# Patient Record
Sex: Female | Born: 1945 | Race: White | Hispanic: No | Marital: Married | Smoking: Never smoker
Health system: Southern US, Community
[De-identification: ages and names within clinical notes are randomized; demographics above are authoritative.]

## PROBLEM LIST (undated history)

## (undated) DIAGNOSIS — I499 Cardiac arrhythmia, unspecified: Secondary | ICD-10-CM

## (undated) DIAGNOSIS — K219 Gastro-esophageal reflux disease without esophagitis: Secondary | ICD-10-CM

## (undated) DIAGNOSIS — I480 Paroxysmal atrial fibrillation: Secondary | ICD-10-CM

## (undated) DIAGNOSIS — R059 Cough, unspecified: Secondary | ICD-10-CM

## (undated) DIAGNOSIS — M1990 Unspecified osteoarthritis, unspecified site: Secondary | ICD-10-CM

## (undated) DIAGNOSIS — I341 Nonrheumatic mitral (valve) prolapse: Secondary | ICD-10-CM

## (undated) DIAGNOSIS — Z9109 Other allergy status, other than to drugs and biological substances: Secondary | ICD-10-CM

## (undated) DIAGNOSIS — N200 Calculus of kidney: Secondary | ICD-10-CM

## (undated) DIAGNOSIS — I10 Essential (primary) hypertension: Secondary | ICD-10-CM

## (undated) DIAGNOSIS — R05 Cough: Secondary | ICD-10-CM

## (undated) DIAGNOSIS — E7800 Pure hypercholesterolemia, unspecified: Secondary | ICD-10-CM

## (undated) DIAGNOSIS — T753XXA Motion sickness, initial encounter: Secondary | ICD-10-CM

## (undated) HISTORY — PX: ESOPHAGOGASTRODUODENOSCOPY: SHX1529

## (undated) HISTORY — PX: TOE SURGERY: SHX1073

## (undated) HISTORY — PX: DILATION AND CURETTAGE OF UTERUS: SHX78

## (undated) HISTORY — PX: COLONOSCOPY: SHX174

## (undated) HISTORY — PX: CYST EXCISION: SHX5701

---

## 2004-01-03 ENCOUNTER — Other Ambulatory Visit: Payer: Self-pay

## 2005-01-18 ENCOUNTER — Ambulatory Visit: Payer: Self-pay | Admitting: Podiatry

## 2005-02-01 ENCOUNTER — Ambulatory Visit: Payer: Self-pay | Admitting: Internal Medicine

## 2005-06-11 ENCOUNTER — Ambulatory Visit: Payer: Self-pay | Admitting: Unknown Physician Specialty

## 2006-02-21 ENCOUNTER — Ambulatory Visit: Payer: Self-pay | Admitting: Internal Medicine

## 2006-03-12 ENCOUNTER — Ambulatory Visit: Payer: Self-pay | Admitting: Internal Medicine

## 2007-02-24 ENCOUNTER — Ambulatory Visit: Payer: Self-pay | Admitting: Internal Medicine

## 2008-03-09 ENCOUNTER — Ambulatory Visit: Payer: Self-pay | Admitting: Internal Medicine

## 2008-08-30 ENCOUNTER — Ambulatory Visit: Payer: Self-pay | Admitting: Internal Medicine

## 2008-09-01 ENCOUNTER — Ambulatory Visit: Payer: Self-pay | Admitting: Internal Medicine

## 2009-03-10 ENCOUNTER — Ambulatory Visit: Payer: Self-pay | Admitting: Internal Medicine

## 2009-03-15 ENCOUNTER — Ambulatory Visit: Payer: Self-pay | Admitting: Internal Medicine

## 2010-02-23 ENCOUNTER — Ambulatory Visit: Payer: Self-pay | Admitting: Internal Medicine

## 2010-03-21 ENCOUNTER — Ambulatory Visit: Payer: Self-pay | Admitting: Internal Medicine

## 2010-10-05 ENCOUNTER — Ambulatory Visit: Payer: Self-pay | Admitting: Unknown Physician Specialty

## 2011-03-27 ENCOUNTER — Ambulatory Visit: Payer: Self-pay | Admitting: Family Medicine

## 2012-04-03 ENCOUNTER — Ambulatory Visit: Payer: Self-pay | Admitting: Family Medicine

## 2013-05-20 ENCOUNTER — Ambulatory Visit: Payer: Self-pay | Admitting: Family Medicine

## 2013-06-05 ENCOUNTER — Ambulatory Visit: Payer: Self-pay | Admitting: Family Medicine

## 2013-11-01 ENCOUNTER — Ambulatory Visit: Payer: Self-pay | Admitting: Physician Assistant

## 2014-05-25 ENCOUNTER — Ambulatory Visit: Payer: Self-pay | Admitting: Family Medicine

## 2015-06-27 ENCOUNTER — Encounter: Payer: Self-pay | Admitting: *Deleted

## 2015-06-30 NOTE — Discharge Instructions (Signed)

## 2015-07-04 ENCOUNTER — Ambulatory Visit: Payer: Medicare Other | Admitting: Anesthesiology

## 2015-07-04 ENCOUNTER — Ambulatory Visit
Admission: RE | Admit: 2015-07-04 | Discharge: 2015-07-04 | Disposition: A | Discharge status: Home / Self Care | Payer: Medicare Other | Source: Ambulatory Visit | Attending: Ophthalmology | Admitting: Ophthalmology

## 2015-07-04 ENCOUNTER — Encounter
Admission: RE | Disposition: A | Discharge status: Home / Self Care | Payer: Self-pay | Source: Ambulatory Visit | Attending: Ophthalmology

## 2015-07-04 DIAGNOSIS — Z881 Allergy status to other antibiotic agents status: Secondary | ICD-10-CM | POA: Diagnosis not present

## 2015-07-04 DIAGNOSIS — Z87442 Personal history of urinary calculi: Secondary | ICD-10-CM | POA: Diagnosis not present

## 2015-07-04 DIAGNOSIS — R0602 Shortness of breath: Secondary | ICD-10-CM | POA: Diagnosis not present

## 2015-07-04 DIAGNOSIS — R002 Palpitations: Secondary | ICD-10-CM | POA: Diagnosis not present

## 2015-07-04 DIAGNOSIS — R05 Cough: Secondary | ICD-10-CM | POA: Insufficient documentation

## 2015-07-04 DIAGNOSIS — I499 Cardiac arrhythmia, unspecified: Secondary | ICD-10-CM | POA: Insufficient documentation

## 2015-07-04 DIAGNOSIS — M7989 Other specified soft tissue disorders: Secondary | ICD-10-CM | POA: Diagnosis not present

## 2015-07-04 DIAGNOSIS — R011 Cardiac murmur, unspecified: Secondary | ICD-10-CM | POA: Insufficient documentation

## 2015-07-04 DIAGNOSIS — H2511 Age-related nuclear cataract, right eye: Secondary | ICD-10-CM | POA: Diagnosis not present

## 2015-07-04 DIAGNOSIS — I10 Essential (primary) hypertension: Secondary | ICD-10-CM | POA: Insufficient documentation

## 2015-07-04 DIAGNOSIS — J40 Bronchitis, not specified as acute or chronic: Secondary | ICD-10-CM | POA: Insufficient documentation

## 2015-07-04 DIAGNOSIS — K219 Gastro-esophageal reflux disease without esophagitis: Secondary | ICD-10-CM | POA: Insufficient documentation

## 2015-07-04 DIAGNOSIS — E7800 Pure hypercholesterolemia, unspecified: Secondary | ICD-10-CM | POA: Diagnosis not present

## 2015-07-04 HISTORY — DX: Calculus of kidney: N20.0

## 2015-07-04 HISTORY — DX: Gastro-esophageal reflux disease without esophagitis: K21.9

## 2015-07-04 HISTORY — DX: Motion sickness, initial encounter: T75.3XXA

## 2015-07-04 HISTORY — DX: Nonrheumatic mitral (valve) prolapse: I34.1

## 2015-07-04 HISTORY — PX: CATARACT EXTRACTION W/PHACO: SHX586

## 2015-07-04 HISTORY — DX: Unspecified osteoarthritis, unspecified site: M19.90

## 2015-07-04 HISTORY — DX: Cough, unspecified: R05.9

## 2015-07-04 HISTORY — DX: Cardiac arrhythmia, unspecified: I49.9

## 2015-07-04 HISTORY — DX: Other allergy status, other than to drugs and biological substances: Z91.09

## 2015-07-04 HISTORY — DX: Pure hypercholesterolemia, unspecified: E78.00

## 2015-07-04 HISTORY — DX: Essential (primary) hypertension: I10

## 2015-07-04 HISTORY — DX: Cough: R05

## 2015-07-04 SURGERY — PHACOEMULSIFICATION, CATARACT, WITH IOL INSERTION
Anesthesia: Monitor Anesthesia Care | Laterality: Right | Wound class: Clean

## 2015-07-04 MED ORDER — TETRACAINE HCL 0.5 % OP SOLN
1.0000 [drp] | OPHTHALMIC | Status: DC | PRN
  Administered 2015-07-04: 1 [drp] via OPHTHALMIC

## 2015-07-04 MED ORDER — ACETAMINOPHEN 325 MG PO TABS: 325.0000 mg | ORAL_TABLET | ORAL | Status: DC | PRN

## 2015-07-04 MED ORDER — BRIMONIDINE TARTRATE 0.2 % OP SOLN
OPHTHALMIC | Status: DC | PRN
  Administered 2015-07-04: 1 [drp]

## 2015-07-04 MED ORDER — LIDOCAINE HCL (PF) 4 % IJ SOLN
INTRAOCULAR | Status: DC | PRN
  Administered 2015-07-04: 1 mL via OPHTHALMIC

## 2015-07-04 MED ORDER — FENTANYL CITRATE (PF) 100 MCG/2ML IJ SOLN
INTRAMUSCULAR | Status: DC | PRN
  Administered 2015-07-04: 50 ug via INTRAVENOUS

## 2015-07-04 MED ORDER — POVIDONE-IODINE 5 % OP SOLN
1.0000 "application " | OPHTHALMIC | Status: DC | PRN
  Administered 2015-07-04: 1 via OPHTHALMIC

## 2015-07-04 MED ORDER — ACETAMINOPHEN 160 MG/5ML PO SOLN: 325.0000 mg | ORAL | Status: DC | PRN

## 2015-07-04 MED ORDER — BSS IO SOLN
INTRAOCULAR | Status: DC | PRN
  Administered 2015-07-04: .2 mL via OPHTHALMIC

## 2015-07-04 MED ORDER — LACTATED RINGERS IV SOLN: INTRAVENOUS | Status: DC

## 2015-07-04 MED ORDER — NA HYALUR & NA CHOND-NA HYALUR 0.4-0.35 ML IO KIT
PACK | INTRAOCULAR | Status: DC | PRN
  Administered 2015-07-04: 1 mL via INTRAOCULAR

## 2015-07-04 MED ORDER — ONDANSETRON HCL 4 MG/2ML IJ SOLN
INTRAMUSCULAR | Status: DC | PRN
  Administered 2015-07-04: 4 mg via INTRAVENOUS

## 2015-07-04 MED ORDER — EPINEPHRINE HCL 1 MG/ML IJ SOLN
INTRAOCULAR | Status: DC | PRN
  Administered 2015-07-04: 136 mL via OPHTHALMIC

## 2015-07-04 MED ORDER — ARMC OPHTHALMIC DILATING GEL
1.0000 "application " | OPHTHALMIC | Status: DC | PRN
  Administered 2015-07-04 (×2): 1 via OPHTHALMIC

## 2015-07-04 MED ORDER — TIMOLOL MALEATE 0.5 % OP SOLN
OPHTHALMIC | Status: DC | PRN
  Administered 2015-07-04: 1 [drp] via OPHTHALMIC

## 2015-07-04 MED ORDER — MIDAZOLAM HCL 2 MG/2ML IJ SOLN
INTRAMUSCULAR | Status: DC | PRN
  Administered 2015-07-04 (×2): 1 mg via INTRAVENOUS

## 2015-07-04 SURGICAL SUPPLY — 29 items
APPLICATOR COTTON TIP 3IN (MISCELLANEOUS) ×3 IMPLANT
CANNULA ANT/CHMB 27GA (MISCELLANEOUS) ×3 IMPLANT
DISSECTOR HYDRO NUCLEUS 50X22 (MISCELLANEOUS) ×3 IMPLANT
GLOVE BIO SURGEON STRL SZ7 (GLOVE) ×3 IMPLANT
GLOVE SURG LX 6.5 MICRO (GLOVE) ×2
GLOVE SURG LX STRL 6.5 MICRO (GLOVE) ×1 IMPLANT
GOWN STRL REUS W/ TWL LRG LVL3 (GOWN DISPOSABLE) ×2 IMPLANT
GOWN STRL REUS W/TWL LRG LVL3 (GOWN DISPOSABLE) ×4
LENS IOL ACRSF IQ PC 22.0 (Intraocular Lens) ×1 IMPLANT
LENS IOL ACRYSOF IQ POST 22.0 (Intraocular Lens) ×3 IMPLANT
MARKER SKIN SURG W/RULER VIO (MISCELLANEOUS) ×3 IMPLANT
NEEDLE FILTER BLUNT 18X 1/2SAF (NEEDLE) ×2
NEEDLE FILTER BLUNT 18X1 1/2 (NEEDLE) ×1 IMPLANT
PACK CATARACT BRASINGTON (MISCELLANEOUS) ×3 IMPLANT
PACK EYE AFTER SURG (MISCELLANEOUS) ×3 IMPLANT
PACK OPTHALMIC (MISCELLANEOUS) ×3 IMPLANT
RING MALYGIN 7.0 (MISCELLANEOUS) IMPLANT
SOL BAL SALT 15ML (MISCELLANEOUS)
SOLUTION BAL SALT 15ML (MISCELLANEOUS) IMPLANT
SUT ETHILON 10-0 CS-B-6CS-B-6 (SUTURE)
SUT VICRYL  9 0 (SUTURE)
SUT VICRYL 9 0 (SUTURE) IMPLANT
SUTURE EHLN 10-0 CS-B-6CS-B-6 (SUTURE) IMPLANT
SYR 3ML LL SCALE MARK (SYRINGE) ×3 IMPLANT
SYR TB 1ML LUER SLIP (SYRINGE) ×3 IMPLANT
WATER STERILE IRR 250ML POUR (IV SOLUTION) ×3 IMPLANT
WATER STERILE IRR 500ML POUR (IV SOLUTION) IMPLANT
WICK EYE OCUCEL (MISCELLANEOUS) IMPLANT
WIPE NON LINTING 3.25X3.25 (MISCELLANEOUS) ×3 IMPLANT

## 2015-07-04 NOTE — Anesthesia Postprocedure Evaluation (Signed)
Anesthesia Post Note  Patient: KAYSA SILLA  Procedure(s) Performed: Procedure(s) (LRB): CATARACT EXTRACTION PHACO AND INTRAOCULAR LENS PLACEMENT (IOC) (Right)  Patient location during evaluation: PACU Anesthesia Type: MAC Level of consciousness: awake and alert and oriented Pain management: satisfactory to patient Vital Signs Assessment: post-procedure vital signs reviewed and stable Respiratory status: spontaneous breathing, nonlabored ventilation and respiratory function stable Cardiovascular status: blood pressure returned to baseline and stable Postop Assessment: Adequate PO intake and No signs of nausea or vomiting Anesthetic complications: no    Last Vitals:  Filed Vitals:   07/04/15 0951 07/04/15 0958  BP: 141/72 139/74  Pulse: 66 63  Temp: 36.4 C   Resp: 11 17    Last Pain: There were no vitals filed for this visit.               Raliegh Ip

## 2015-07-04 NOTE — Op Note (Signed)
Date of Surgery: 07/04/2015  PREOPERATIVE DIAGNOSES: Visually significant nuclear sclerotic cataract, right eye.  POSTOPERATIVE DIAGNOSES: Same  PROCEDURES PERFORMED: Cataract extraction with intraocular lens implant, right eye.  SURGEON: Almon Hercules, M.D.  ANESTHESIA: MAC and topical  IMPLANTS: AcrySof IQ [id number] +22.0 D   Implant Name Type Inv. Item Serial No. Manufacturer Lot No. LRB No. Used  IMPLANT LENS - [id number] Intraocular Lens IMPLANT LENS [id number] ALCON   Right 1     COMPLICATIONS: None.  DESCRIPTION OF PROCEDURE: Therapeutic options were discussed with the patient preoperatively, including a discussion of risks and benefits of surgery. Informed consent was obtained. An IOL-Master and immersion biometry were used to take the lens measurements, and a dilated fundus exam was performed within 6 months of the surgical date.  The patient was premedicated and brought to the operating room and placed on the operating table in the supine position. After adequate anesthesia, the patient was prepped and draped in the usual sterile ophthalmic fashion. A wire lid speculum was inserted and the microscope was positioned. A Superblade was used to create a paracentesis site at the limbus and a small amount of dilute preservative free lidocaine was instilled into the anterior chamber, followed by dispersive viscoelastic. A clear corneal incision was created temporally using a 2.4 mm keratome blade. Capsulorrhexis was then performed. In situ phacoemulsification was performed.  Cortical material was removed with the irrigation-aspiration unit. Dispersive viscoelastic was instilled to open the capsular bag. A posterior chamber intraocular lens with the specifications above was inserted and positioned. Irrigation-aspiration was used to remove all viscoelastic. Vigamox 1cc was instilled into the anterior chamber, and the corneal incision was checked and found to be water tight. The eyelid  speculum was removed.  The operative eye was covered with protective goggles after instilling 1 drop of timolol and brimonidine. The patient tolerated the procedure well. There were no complications.

## 2015-07-04 NOTE — Transfer of Care (Signed)
Immediate Anesthesia Transfer of Care Note  Patient: Carly Beltran  Procedure(s) Performed: Procedure(s): CATARACT EXTRACTION PHACO AND INTRAOCULAR LENS PLACEMENT (IOC) (Right)  Patient Location: PACU  Anesthesia Type: MAC  Level of Consciousness: awake, alert  and patient cooperative  Airway and Oxygen Therapy: Patient Spontanous Breathing and Patient connected to supplemental oxygen  Post-op Assessment: Post-op Vital signs reviewed, Patient's Cardiovascular Status Stable, Respiratory Function Stable, Patent Airway and No signs of Nausea or vomiting  Post-op Vital Signs: Reviewed and stable  Complications: No apparent anesthesia complications

## 2015-07-04 NOTE — Anesthesia Preprocedure Evaluation (Signed)
Anesthesia Evaluation  Patient identified by MRN, date of birth, ID band  Reviewed: Allergy & Precautions, H&P , NPO status , Patient's Chart, lab work & pertinent test results  Airway Mallampati: III  TM Distance: <3 FB Neck ROM: full    Dental no notable dental hx.    Pulmonary    Pulmonary exam normal        Cardiovascular hypertension, + dysrhythmias  Rhythm:regular Rate:Normal     Neuro/Psych    GI/Hepatic GERD  ,  Endo/Other    Renal/GU      Musculoskeletal   Abdominal   Peds  Hematology   Anesthesia Other Findings   Reproductive/Obstetrics                             Anesthesia Physical Anesthesia Plan  ASA: II  Anesthesia Plan: MAC   Post-op Pain Management:    Induction:   Airway Management Planned:   Additional Equipment:   Intra-op Plan:   Post-operative Plan:   Informed Consent: I have reviewed the patients History and Physical, chart, labs and discussed the procedure including the risks, benefits and alternatives for the proposed anesthesia with the patient or authorized representative who has indicated his/her understanding and acceptance.     Plan Discussed with: CRNA  Anesthesia Plan Comments:         Anesthesia Quick Evaluation

## 2015-07-04 NOTE — H&P (Signed)
H+P reviewed and is up to date, please see paper chart.  

## 2015-07-04 NOTE — Anesthesia Procedure Notes (Signed)
Procedure Name: MAC Performed by: Chalice Philbert Pre-anesthesia Checklist: Patient identified, Emergency Drugs available, Suction available, Timeout performed and Patient being monitored Patient Re-evaluated:Patient Re-evaluated prior to inductionOxygen Delivery Method: Nasal cannula Placement Confirmation: positive ETCO2     

## 2015-07-05 ENCOUNTER — Encounter: Payer: Self-pay | Admitting: Ophthalmology

## 2015-09-05 ENCOUNTER — Encounter: Payer: Self-pay | Admitting: *Deleted

## 2015-09-09 NOTE — Discharge Instructions (Signed)

## 2015-09-12 ENCOUNTER — Encounter
Admission: RE | Disposition: A | Discharge status: Home / Self Care | Payer: Self-pay | Source: Ambulatory Visit | Attending: Ophthalmology

## 2015-09-12 ENCOUNTER — Ambulatory Visit: Payer: Medicare HMO | Admitting: Anesthesiology

## 2015-09-12 ENCOUNTER — Ambulatory Visit
Admission: RE | Admit: 2015-09-12 | Discharge: 2015-09-12 | Disposition: A | Discharge status: Home / Self Care | Payer: Medicare HMO | Source: Ambulatory Visit | Attending: Ophthalmology | Admitting: Ophthalmology

## 2015-09-12 DIAGNOSIS — E7800 Pure hypercholesterolemia, unspecified: Secondary | ICD-10-CM | POA: Insufficient documentation

## 2015-09-12 DIAGNOSIS — Z79899 Other long term (current) drug therapy: Secondary | ICD-10-CM | POA: Diagnosis not present

## 2015-09-12 DIAGNOSIS — K219 Gastro-esophageal reflux disease without esophagitis: Secondary | ICD-10-CM | POA: Insufficient documentation

## 2015-09-12 DIAGNOSIS — Z881 Allergy status to other antibiotic agents status: Secondary | ICD-10-CM | POA: Diagnosis not present

## 2015-09-12 DIAGNOSIS — Z882 Allergy status to sulfonamides status: Secondary | ICD-10-CM | POA: Insufficient documentation

## 2015-09-12 DIAGNOSIS — H2512 Age-related nuclear cataract, left eye: Secondary | ICD-10-CM | POA: Insufficient documentation

## 2015-09-12 DIAGNOSIS — Z9889 Other specified postprocedural states: Secondary | ICD-10-CM | POA: Diagnosis not present

## 2015-09-12 DIAGNOSIS — Z87442 Personal history of urinary calculi: Secondary | ICD-10-CM | POA: Diagnosis not present

## 2015-09-12 DIAGNOSIS — Z9841 Cataract extraction status, right eye: Secondary | ICD-10-CM | POA: Diagnosis not present

## 2015-09-12 DIAGNOSIS — H269 Unspecified cataract: Secondary | ICD-10-CM | POA: Diagnosis present

## 2015-09-12 HISTORY — PX: CATARACT EXTRACTION W/PHACO: SHX586

## 2015-09-12 SURGERY — PHACOEMULSIFICATION, CATARACT, WITH IOL INSERTION
Anesthesia: Monitor Anesthesia Care | Laterality: Left | Wound class: Clean

## 2015-09-12 MED ORDER — BRIMONIDINE TARTRATE 0.2 % OP SOLN
OPHTHALMIC | Status: DC | PRN
  Administered 2015-09-12: 1 [drp] via OPHTHALMIC

## 2015-09-12 MED ORDER — TETRACAINE HCL 0.5 % OP SOLN
1.0000 [drp] | OPHTHALMIC | Status: DC | PRN
  Administered 2015-09-12: 1 [drp] via OPHTHALMIC

## 2015-09-12 MED ORDER — TIMOLOL MALEATE 0.5 % OP SOLN
OPHTHALMIC | Status: DC | PRN
  Administered 2015-09-12: 1 [drp] via OPHTHALMIC

## 2015-09-12 MED ORDER — MIDAZOLAM HCL 2 MG/2ML IJ SOLN
INTRAMUSCULAR | Status: DC | PRN
  Administered 2015-09-12: 2 mg via INTRAVENOUS

## 2015-09-12 MED ORDER — LIDOCAINE HCL (PF) 4 % IJ SOLN
INTRAOCULAR | Status: DC | PRN
  Administered 2015-09-12: 1 mL via OPHTHALMIC

## 2015-09-12 MED ORDER — ONDANSETRON HCL 4 MG/2ML IJ SOLN: 4.0000 mg | Freq: Once | INTRAMUSCULAR | Status: DC | PRN

## 2015-09-12 MED ORDER — ACETAMINOPHEN 160 MG/5ML PO SOLN: 325.0000 mg | ORAL | Status: DC | PRN

## 2015-09-12 MED ORDER — POVIDONE-IODINE 5 % OP SOLN
1.0000 "application " | OPHTHALMIC | Status: DC | PRN
  Administered 2015-09-12: 1 via OPHTHALMIC

## 2015-09-12 MED ORDER — ACETAMINOPHEN 325 MG PO TABS
325.0000 mg | ORAL_TABLET | ORAL | Status: DC | PRN
  Administered 2015-09-12: 650 mg via ORAL

## 2015-09-12 MED ORDER — ARMC OPHTHALMIC DILATING GEL
1.0000 "application " | OPHTHALMIC | Status: DC | PRN
  Administered 2015-09-12 (×2): 1 via OPHTHALMIC

## 2015-09-12 MED ORDER — NA HYALUR & NA CHOND-NA HYALUR 0.4-0.35 ML IO KIT
PACK | INTRAOCULAR | Status: DC | PRN
  Administered 2015-09-12: 1 mL via INTRAOCULAR

## 2015-09-12 MED ORDER — FENTANYL CITRATE (PF) 100 MCG/2ML IJ SOLN
INTRAMUSCULAR | Status: DC | PRN
  Administered 2015-09-12: 50 ug via INTRAVENOUS

## 2015-09-12 MED ORDER — ACETAMINOPHEN 325 MG PO TABS: 325.0000 mg | ORAL_TABLET | ORAL | Status: DC | PRN

## 2015-09-12 MED ORDER — MOXIFLOXACIN HCL 0.5 % OP SOLN
OPHTHALMIC | Status: DC | PRN
  Administered 2015-09-12: 1 [drp] via OPHTHALMIC

## 2015-09-12 MED ORDER — EPINEPHRINE HCL 1 MG/ML IJ SOLN
INTRAMUSCULAR | Status: DC | PRN
  Administered 2015-09-12: 96 mL via OPHTHALMIC

## 2015-09-12 SURGICAL SUPPLY — 29 items
APPLICATOR COTTON TIP 3IN (MISCELLANEOUS) ×3 IMPLANT
CANNULA ANT/CHMB 27GA (MISCELLANEOUS) ×3 IMPLANT
DISSECTOR HYDRO NUCLEUS 50X22 (MISCELLANEOUS) ×3 IMPLANT
GLOVE BIO SURGEON STRL SZ7 (GLOVE) ×3 IMPLANT
GLOVE SURG LX 6.5 MICRO (GLOVE) ×2
GLOVE SURG LX STRL 6.5 MICRO (GLOVE) ×1 IMPLANT
GOWN STRL REUS W/ TWL LRG LVL3 (GOWN DISPOSABLE) ×2 IMPLANT
GOWN STRL REUS W/TWL LRG LVL3 (GOWN DISPOSABLE) ×4
LENS IOL ACRSF IQ PC 20.5 (Intraocular Lens) ×1 IMPLANT
LENS IOL ACRYSOF IQ POST 20.5 (Intraocular Lens) ×3 IMPLANT
MARKER SKIN SURG W/RULER VIO (MISCELLANEOUS) ×3 IMPLANT
NEEDLE FILTER BLUNT 18X 1/2SAF (NEEDLE) ×2
NEEDLE FILTER BLUNT 18X1 1/2 (NEEDLE) ×1 IMPLANT
PACK CATARACT BRASINGTON (MISCELLANEOUS) ×3 IMPLANT
PACK EYE AFTER SURG (MISCELLANEOUS) ×3 IMPLANT
PACK OPTHALMIC (MISCELLANEOUS) ×3 IMPLANT
RING MALYGIN 7.0 (MISCELLANEOUS) IMPLANT
SOL BAL SALT 15ML (MISCELLANEOUS)
SOLUTION BAL SALT 15ML (MISCELLANEOUS) IMPLANT
SUT ETHILON 10-0 CS-B-6CS-B-6 (SUTURE)
SUT VICRYL  9 0 (SUTURE)
SUT VICRYL 9 0 (SUTURE) IMPLANT
SUTURE EHLN 10-0 CS-B-6CS-B-6 (SUTURE) IMPLANT
SYR 3ML LL SCALE MARK (SYRINGE) ×3 IMPLANT
SYR TB 1ML LUER SLIP (SYRINGE) ×3 IMPLANT
WATER STERILE IRR 250ML POUR (IV SOLUTION) ×3 IMPLANT
WATER STERILE IRR 500ML POUR (IV SOLUTION) IMPLANT
WICK EYE OCUCEL (MISCELLANEOUS) IMPLANT
WIPE NON LINTING 3.25X3.25 (MISCELLANEOUS) ×3 IMPLANT

## 2015-09-12 NOTE — Anesthesia Preprocedure Evaluation (Signed)
Anesthesia Evaluation  Patient identified by MRN, date of birth, ID band  Reviewed: Allergy & Precautions, H&P , NPO status , Patient's Chart, lab work & pertinent test results  Airway Mallampati: III  TM Distance: <3 FB Neck ROM: full    Dental no notable dental hx.    Pulmonary    Pulmonary exam normal        Cardiovascular hypertension, + dysrhythmias  Rhythm:regular Rate:Normal     Neuro/Psych    GI/Hepatic GERD  ,  Endo/Other    Renal/GU      Musculoskeletal   Abdominal   Peds  Hematology   Anesthesia Other Findings   Reproductive/Obstetrics                             Anesthesia Physical  Anesthesia Plan  ASA: II  Anesthesia Plan: MAC   Post-op Pain Management:    Induction:   Airway Management Planned:   Additional Equipment:   Intra-op Plan:   Post-operative Plan:   Informed Consent: I have reviewed the patients History and Physical, chart, labs and discussed the procedure including the risks, benefits and alternatives for the proposed anesthesia with the patient or authorized representative who has indicated his/her understanding and acceptance.     Plan Discussed with: CRNA  Anesthesia Plan Comments:         Anesthesia Quick Evaluation

## 2015-09-12 NOTE — Transfer of Care (Signed)
Immediate Anesthesia Transfer of Care Note  Patient: Carly Beltran  Procedure(s) Performed: Procedure(s): CATARACT EXTRACTION PHACO AND INTRAOCULAR LENS PLACEMENT (IOC) (Left)  Patient Location: PACU  Anesthesia Type: MAC  Level of Consciousness: awake, alert  and patient cooperative  Airway and Oxygen Therapy: Patient Spontanous Breathing and Patient connected to supplemental oxygen  Post-op Assessment: Post-op Vital signs reviewed, Patient's Cardiovascular Status Stable, Respiratory Function Stable, Patent Airway and No signs of Nausea or vomiting  Post-op Vital Signs: Reviewed and stable  Complications: No apparent anesthesia complications

## 2015-09-12 NOTE — H&P (Signed)
H+P reviewed and is up to date, please see paper chart.  

## 2015-09-12 NOTE — Anesthesia Procedure Notes (Signed)
Procedure Name: MAC Performed by: Neli Fofana Pre-anesthesia Checklist: Patient identified, Emergency Drugs available, Suction available, Timeout performed and Patient being monitored Patient Re-evaluated:Patient Re-evaluated prior to inductionOxygen Delivery Method: Nasal cannula Placement Confirmation: positive ETCO2     

## 2015-09-12 NOTE — Anesthesia Postprocedure Evaluation (Signed)
Anesthesia Post Note  Patient: Carly Beltran  Procedure(s) Performed: Procedure(s) (LRB): CATARACT EXTRACTION PHACO AND INTRAOCULAR LENS PLACEMENT (IOC) (Left)  Patient location during evaluation: PACU Anesthesia Type: MAC Level of consciousness: awake and alert Pain management: pain level controlled Vital Signs Assessment: post-procedure vital signs reviewed and stable Respiratory status: spontaneous breathing, nonlabored ventilation, respiratory function stable and patient connected to nasal cannula oxygen Cardiovascular status: stable and blood pressure returned to baseline Anesthetic complications: no    Amaryllis Dyke

## 2015-09-12 NOTE — Op Note (Signed)
Date of Surgery: 09/12/2015  PREOPERATIVE DIAGNOSES: Visually significant nuclear sclerotic cataract, left eye.  POSTOPERATIVE DIAGNOSES: Same  PROCEDURES PERFORMED: Cataract extraction with intraocular lens implant, left eye.  SURGEON: Almon Hercules, M.D.  ANESTHESIA: MAC and topical  IMPLANTS: AcrySof IQ [id number] +20.5 D   Implant Name Type Inv. Item Serial No. Manufacturer Lot No. LRB No. Used  IMPLANT LENS - [id number] Intraocular Lens IMPLANT LENS [id number] ALCON   Left 1    COMPLICATIONS: None.  DESCRIPTION OF PROCEDURE: Therapeutic options were discussed with the patient preoperatively, including a discussion of risks and benefits of surgery. Informed consent was obtained. An IOL-Master and immersion biometry were used to take the lens measurements, and a dilated fundus exam was performed within 6 months of the surgical date.  The patient was premedicated and brought to the operating room and placed on the operating table in the supine position. After adequate anesthesia, the patient was prepped and draped in the usual sterile ophthalmic fashion. A wire lid speculum was inserted and the microscope was positioned. A Superblade was used to create a paracentesis site at the limbus and a small amount of dilute preservative free lidocaine was instilled into the anterior chamber, followed by dispersive viscoelastic. A clear corneal incision was created temporally using a 2.4 mm keratome blade. Capsulorrhexis was then performed. In situ phacoemulsification was performed.  Cortical material was removed with the irrigation-aspiration unit. Dispersive viscoelastic was instilled to open the capsular bag. A posterior chamber intraocular lens with the specifications above was inserted and positioned. Irrigation-aspiration was used to remove all viscoelastic. Vigamox 1cc was instilled into the anterior chamber, and the corneal incision was checked and found to be water tight. The eyelid speculum  was removed.  The operative eye was covered with protective goggles after instilling 1 drop of timolol and brimonidine. The patient tolerated the procedure well. There were no complications.

## 2015-09-13 ENCOUNTER — Encounter: Payer: Self-pay | Admitting: Ophthalmology

## 2015-12-06 ENCOUNTER — Other Ambulatory Visit: Payer: Self-pay | Admitting: Family Medicine

## 2015-12-06 DIAGNOSIS — Z1231 Encounter for screening mammogram for malignant neoplasm of breast: Secondary | ICD-10-CM

## 2015-12-20 ENCOUNTER — Ambulatory Visit
Admission: RE | Admit: 2015-12-20 | Discharge: 2015-12-20 | Disposition: A | Discharge status: Home / Self Care | Payer: Medicare HMO | Source: Ambulatory Visit | Attending: Family Medicine | Admitting: Family Medicine

## 2015-12-20 ENCOUNTER — Other Ambulatory Visit: Payer: Self-pay | Admitting: Family Medicine

## 2015-12-20 DIAGNOSIS — Z1231 Encounter for screening mammogram for malignant neoplasm of breast: Secondary | ICD-10-CM | POA: Insufficient documentation

## 2015-12-22 ENCOUNTER — Encounter: Payer: Self-pay | Admitting: *Deleted

## 2015-12-23 ENCOUNTER — Ambulatory Visit: Payer: Medicare HMO | Admitting: Anesthesiology

## 2015-12-23 ENCOUNTER — Encounter: Payer: Self-pay | Admitting: *Deleted

## 2015-12-23 ENCOUNTER — Ambulatory Visit
Admission: RE | Admit: 2015-12-23 | Discharge: 2015-12-23 | Disposition: A | Discharge status: Home / Self Care | Payer: Medicare HMO | Source: Ambulatory Visit | Attending: Unknown Physician Specialty | Admitting: Unknown Physician Specialty

## 2015-12-23 ENCOUNTER — Encounter
Admission: RE | Disposition: A | Discharge status: Home / Self Care | Payer: Self-pay | Source: Ambulatory Visit | Attending: Unknown Physician Specialty

## 2015-12-23 DIAGNOSIS — K6389 Other specified diseases of intestine: Secondary | ICD-10-CM | POA: Insufficient documentation

## 2015-12-23 DIAGNOSIS — K5730 Diverticulosis of large intestine without perforation or abscess without bleeding: Secondary | ICD-10-CM | POA: Insufficient documentation

## 2015-12-23 DIAGNOSIS — K635 Polyp of colon: Secondary | ICD-10-CM | POA: Diagnosis not present

## 2015-12-23 DIAGNOSIS — D122 Benign neoplasm of ascending colon: Secondary | ICD-10-CM | POA: Insufficient documentation

## 2015-12-23 DIAGNOSIS — Z1211 Encounter for screening for malignant neoplasm of colon: Secondary | ICD-10-CM | POA: Insufficient documentation

## 2015-12-23 DIAGNOSIS — K219 Gastro-esophageal reflux disease without esophagitis: Secondary | ICD-10-CM | POA: Diagnosis not present

## 2015-12-23 DIAGNOSIS — Z87442 Personal history of urinary calculi: Secondary | ICD-10-CM | POA: Insufficient documentation

## 2015-12-23 DIAGNOSIS — M19049 Primary osteoarthritis, unspecified hand: Secondary | ICD-10-CM | POA: Diagnosis not present

## 2015-12-23 DIAGNOSIS — Z882 Allergy status to sulfonamides status: Secondary | ICD-10-CM | POA: Diagnosis not present

## 2015-12-23 DIAGNOSIS — I341 Nonrheumatic mitral (valve) prolapse: Secondary | ICD-10-CM | POA: Insufficient documentation

## 2015-12-23 DIAGNOSIS — Z8371 Family history of colonic polyps: Secondary | ICD-10-CM | POA: Diagnosis present

## 2015-12-23 DIAGNOSIS — Z881 Allergy status to other antibiotic agents status: Secondary | ICD-10-CM | POA: Diagnosis not present

## 2015-12-23 DIAGNOSIS — I10 Essential (primary) hypertension: Secondary | ICD-10-CM | POA: Insufficient documentation

## 2015-12-23 DIAGNOSIS — E7800 Pure hypercholesterolemia, unspecified: Secondary | ICD-10-CM | POA: Insufficient documentation

## 2015-12-23 DIAGNOSIS — K640 First degree hemorrhoids: Secondary | ICD-10-CM | POA: Insufficient documentation

## 2015-12-23 DIAGNOSIS — Z803 Family history of malignant neoplasm of breast: Secondary | ICD-10-CM | POA: Diagnosis not present

## 2015-12-23 HISTORY — PX: COLONOSCOPY WITH PROPOFOL: SHX5780

## 2015-12-23 SURGERY — COLONOSCOPY WITH PROPOFOL
Anesthesia: General

## 2015-12-23 MED ORDER — SODIUM CHLORIDE 0.9 % IV SOLN: INTRAVENOUS | Status: DC

## 2015-12-23 MED ORDER — MIDAZOLAM HCL 5 MG/5ML IJ SOLN
INTRAMUSCULAR | Status: DC | PRN
  Administered 2015-12-23: 1 mg via INTRAVENOUS

## 2015-12-23 MED ORDER — FENTANYL CITRATE (PF) 100 MCG/2ML IJ SOLN
INTRAMUSCULAR | Status: DC | PRN
  Administered 2015-12-23: 50 ug via INTRAVENOUS

## 2015-12-23 MED ORDER — PROPOFOL 10 MG/ML IV BOLUS
INTRAVENOUS | Status: DC | PRN
Start: 2015-12-23 — End: 2015-12-23
  Administered 2015-12-23: 80 mg via INTRAVENOUS

## 2015-12-23 MED ORDER — PROPOFOL 500 MG/50ML IV EMUL
INTRAVENOUS | Status: DC | PRN
  Administered 2015-12-23: 140 ug/kg/min via INTRAVENOUS

## 2015-12-23 MED ORDER — SODIUM CHLORIDE 0.9 % IV SOLN
INTRAVENOUS | Status: DC
Start: 2015-12-23 — End: 2015-12-23
  Administered 2015-12-23 (×2): via INTRAVENOUS

## 2015-12-23 MED ORDER — LIDOCAINE HCL (CARDIAC) 20 MG/ML IV SOLN
INTRAVENOUS | Status: DC | PRN
  Administered 2015-12-23: 30 mg via INTRAVENOUS

## 2015-12-23 NOTE — Op Note (Signed)
[facility] Gastroenterology Patient Name: Atavia Vanbelle Procedure Date: 12/23/2015 1:07 PM MRN: [id number] Account #: [id number] Date of Birth: 06-24-1946 Admit Type: Outpatient Age: 70 Room: [facility] ENDO ROOM 1 Gender: Female Note Status: Finalized Procedure:            Colonoscopy Indications:          Colon cancer screening in patient at increased risk:                        Family history of 1st-degree relative with colon polyps Providers:            Manya Silvas, MD Referring MD:         Dion Body (Referring MD) Medicines:            Propofol per Anesthesia Complications:        No immediate complications. Procedure:            Pre-Anesthesia Assessment:                       - After reviewing the risks and benefits, the patient                        was deemed in satisfactory condition to undergo the                        procedure.                       After obtaining informed consent, the colonoscope was                        passed under direct vision. Throughout the procedure,                        the patient's blood pressure, pulse, and oxygen                        saturations were monitored continuously. The                        Colonoscope was introduced through the anus and                        advanced to the the cecum, identified by appendiceal                        orifice and ileocecal valve. The colonoscopy was                        performed without difficulty. The patient tolerated the                        procedure well. The quality of the bowel preparation                        was excellent. Findings:      A diminutive polyp was found in the ascending colon. The polyp was       sessile. The polyp was removed with a jumbo cold forceps. Resection and       retrieval were complete.  A small polyp was found in the recto-sigmoid colon. The polyp was       sessile. The polyp was removed with a hot snare.  Resection and retrieval       were complete.      Multiple small-mouthed diverticula were found in the sigmoid colon and       descending colon.      Internal hemorrhoids were found during endoscopy. The hemorrhoids were       small and Grade I (internal hemorrhoids that do not prolapse).      The exam was otherwise without abnormality. Impression:           - One diminutive polyp in the ascending colon, removed                        with a jumbo cold forceps. Resected and retrieved.                       - One small polyp at the recto-sigmoid colon, removed                        with a hot snare. Resected and retrieved.                       - Diverticulosis in the sigmoid colon and in the                        descending colon.                       - Internal hemorrhoids.                       - The examination was otherwise normal. Recommendation:       - Await pathology results. Manya Silvas, MD 12/23/2015 1:33:56 PM This report has been signed electronically. Number of Addenda: 0 Note Initiated On: 12/23/2015 1:07 PM Scope Withdrawal Time: 0 hours 12 minutes 18 seconds  Total Procedure Duration: 0 hours 18 minutes 53 seconds       [facility]

## 2015-12-23 NOTE — Anesthesia Preprocedure Evaluation (Signed)
Anesthesia Evaluation  Patient identified by MRN, date of birth, ID band Patient awake    Reviewed: Allergy & Precautions, H&P , NPO status , Patient's Chart, lab work & pertinent test results, reviewed documented beta blocker date and time   History of Anesthesia Complications Negative for: history of anesthetic complications  Airway Mallampati: II  TM Distance: >3 FB Neck ROM: full    Dental no notable dental hx. (+) Caps   Pulmonary neg pulmonary ROS,    Pulmonary exam normal breath sounds clear to auscultation       Cardiovascular Exercise Tolerance: Good hypertension, On Medications and On Home Beta Blockers (-) angina(-) CAD, (-) Past MI, (-) Cardiac Stents and (-) CABG Normal cardiovascular exam+ dysrhythmias + Valvular Problems/Murmurs MVP  Rhythm:regular Rate:Normal     Neuro/Psych negative neurological ROS  negative psych ROS   GI/Hepatic Neg liver ROS, GERD  ,  Endo/Other  negative endocrine ROS  Renal/GU Renal disease (stones)  negative genitourinary   Musculoskeletal   Abdominal   Peds  Hematology negative hematology ROS (+)   Anesthesia Other Findings Past Medical History:   Motion sickness                                                Comment:Reading in car   GERD (gastroesophageal reflux disease)                       Cough                                                          Comment:due to reflux   Environmental allergies                                      Hypertension                                                 MVP (mitral valve prolapse)                                    Comment:followed by PCP   Dysrhythmia                                                    Comment:palpatations (occasionally)   Hypercholesteremia                                           Kidney stones  Arthritis                                                   Comment:1 finger   Reproductive/Obstetrics negative OB ROS                             Anesthesia Physical Anesthesia Plan  ASA: II  Anesthesia Plan: General   Post-op Pain Management:    Induction:   Airway Management Planned:   Additional Equipment:   Intra-op Plan:   Post-operative Plan:   Informed Consent: I have reviewed the patients History and Physical, chart, labs and discussed the procedure including the risks, benefits and alternatives for the proposed anesthesia with the patient or authorized representative who has indicated his/her understanding and acceptance.   Dental Advisory Given  Plan Discussed with: Anesthesiologist, CRNA and Surgeon  Anesthesia Plan Comments:         Anesthesia Quick Evaluation

## 2015-12-23 NOTE — Transfer of Care (Signed)
Immediate Anesthesia Transfer of Care Note  Patient: Carly Beltran  Procedure(s) Performed: Procedure(s): COLONOSCOPY WITH PROPOFOL (N/A)  Patient Location: PACU and Endoscopy Unit  Anesthesia Type:General  Level of Consciousness: sedated  Airway & Oxygen Therapy: Patient Spontanous Breathing and Patient connected to nasal cannula oxygen  Post-op Assessment: Report given to RN and Post -op Vital signs reviewed and stable  Post vital signs: stable  Last Vitals:  Filed Vitals:   12/23/15 1237  BP: 145/80  Pulse: 75  Temp: 36.2 C  Resp: 16    Last Pain: There were no vitals filed for this visit.       Complications: No apparent anesthesia complications

## 2015-12-23 NOTE — H&P (Signed)
Primary Care Physician:  Dion Body, MD Primary Gastroenterologist:  Dr. Vira Agar  Pre-Procedure History & Physical: HPI:  Carly Beltran is a 70 y.o. female is here for an colonoscopy.   Past Medical History  Diagnosis Date  . Motion sickness     Reading in car  . GERD (gastroesophageal reflux disease)   . Cough     due to reflux  . Environmental allergies   . Hypertension   . MVP (mitral valve prolapse)     followed by PCP  . Dysrhythmia     palpatations (occasionally)  . Hypercholesteremia   . Kidney stones   . Arthritis     1 finger    Past Surgical History  Procedure Laterality Date  . Dilation and curettage of uterus    . Colonoscopy    . Esophagogastroduodenoscopy    . Toe surgery      corns removed  . Cataract extraction w/phaco Right 07/04/2015    Procedure: CATARACT EXTRACTION PHACO AND INTRAOCULAR LENS PLACEMENT (IOC);  Surgeon: Ronnell Freshwater, MD;  Location: [place];  Service: Ophthalmology;  Laterality: Right;  . Cataract extraction w/phaco Left 09/12/2015    Procedure: CATARACT EXTRACTION PHACO AND INTRAOCULAR LENS PLACEMENT (IOC);  Surgeon: Ronnell Freshwater, MD;  Location: [place];  Service: Ophthalmology;  Laterality: Left;    Prior to Admission medications   Medication Sig Start Date End Date Taking? Authorizing Provider  hydrochlorothiazide (HYDRODIURIL) 12.5 MG tablet Take 12.5 mg by mouth daily. AM   Yes Historical Provider, MD  metoprolol succinate (TOPROL-XL) 50 MG 24 hr tablet Take 50 mg by mouth daily. Take with or immediately following a meal.   Yes Historical Provider, MD  atorvastatin (LIPITOR) 20 MG tablet Take 20 mg by mouth daily. PM    Historical Provider, MD  Cholecalciferol 1000 UNITS TBDP Take 1,000 Units by mouth daily. Reported on 09/12/2015    Historical Provider, MD  Dextromethorphan-Menthol [facility] COUGH RELIEF MT) Use as directed in the mouth or throat as needed.    Historical  Provider, MD  loratadine (CLARITIN) 10 MG tablet Take 10 mg by mouth daily as needed for allergies.    Historical Provider, MD  Multiple Vitamin (MULTIVITAMIN) tablet Take 1 tablet by mouth daily. Reported on 09/12/2015    Historical Provider, MD  naproxen sodium (ANAPROX) 220 MG tablet Take 440 mg by mouth 2 (two) times daily with a meal.    Historical Provider, MD  Probiotic Product (PROBIOTIC DAILY PO) Take by mouth.    Historical Provider, MD  saccharomyces boulardii (FLORASTOR) 250 MG capsule Take 250 mg by mouth 2 (two) times daily.    Historical Provider, MD    Allergies as of 12/13/2015 - Review Complete 09/12/2015  Allergen Reaction Noted  . Ampicillin Rash 06/27/2015  . Septra [sulfamethoxazole-trimethoprim] Rash 06/27/2015    Family History  Problem Relation Age of Onset  . Breast cancer Cousin     Social History   Social History  . Marital Status: Married    Spouse Name: N/A  . Number of Children: N/A  . Years of Education: N/A   Occupational History  . Not on file.   Social History Main Topics  . Smoking status: Never Smoker   . Smokeless tobacco: Never Used  . Alcohol Use: 0.6 oz/week    1 Glasses of wine per week  . Drug Use: No  . Sexual Activity: Not on file   Other Topics Concern  . Not on  file   Social History Narrative    Review of Systems: See HPI, otherwise negative ROS  Physical Exam: BP 145/80 mmHg  Pulse 75  Temp(Src) 97.1 F (36.2 C) (Tympanic)  Resp 16  Ht 5\' 6"  (1.676 m)  Wt 76.204 kg (168 lb)  BMI 27.13 kg/m2  SpO2 100% General:   Alert,  pleasant and cooperative in NAD Head:  Normocephalic and atraumatic. Neck:  Supple; no masses or thyromegaly. Lungs:  Clear throughout to auscultation.    Heart:  Regular rate and rhythm. Abdomen:  Soft, nontender and nondistended. Normal bowel sounds, without guarding, and without rebound.   Neurologic:  Alert and  oriented x4;  grossly normal neurologically.  Impression/Plan: Carly Beltran is here for an colonoscopy to be performed for screening due to family history of colon polyps in her mother  Risks, benefits, limitations, and alternatives regarding  colonoscopy have been reviewed with the patient.  Questions have been answered.  All parties agreeable.   Gaylyn Cheers, MD  12/23/2015, 1:08 PM

## 2015-12-26 ENCOUNTER — Encounter: Payer: Self-pay | Admitting: Unknown Physician Specialty

## 2015-12-26 LAB — SURGICAL PATHOLOGY

## 2015-12-26 NOTE — Anesthesia Postprocedure Evaluation (Signed)
Anesthesia Post Note  Patient: Carly Beltran  Procedure(s) Performed: Procedure(s) (LRB): COLONOSCOPY WITH PROPOFOL (N/A)  Patient location during evaluation: Endoscopy Anesthesia Type: General Level of consciousness: awake and alert Pain management: pain level controlled Vital Signs Assessment: post-procedure vital signs reviewed and stable Respiratory status: spontaneous breathing, nonlabored ventilation, respiratory function stable and patient connected to nasal cannula oxygen Cardiovascular status: blood pressure returned to baseline and stable Postop Assessment: no signs of nausea or vomiting Anesthetic complications: no    Last Vitals:  Filed Vitals:   12/23/15 1410 12/23/15 1417  BP: 111/66 117/68  Pulse: 62 63  Temp:    Resp: 17 15    Last Pain: There were no vitals filed for this visit.               [name]

## 2016-06-13 ENCOUNTER — Other Ambulatory Visit: Payer: Self-pay | Admitting: Family Medicine

## 2016-06-13 DIAGNOSIS — N63 Unspecified lump in unspecified breast: Secondary | ICD-10-CM

## 2016-06-21 ENCOUNTER — Ambulatory Visit
Admission: RE | Admit: 2016-06-21 | Discharge: 2016-06-21 | Disposition: A | Discharge status: Home / Self Care | Payer: Medicare HMO | Source: Ambulatory Visit | Attending: Family Medicine | Admitting: Family Medicine

## 2016-06-21 DIAGNOSIS — N6001 Solitary cyst of right breast: Secondary | ICD-10-CM | POA: Insufficient documentation

## 2016-06-21 DIAGNOSIS — R921 Mammographic calcification found on diagnostic imaging of breast: Secondary | ICD-10-CM | POA: Insufficient documentation

## 2016-06-21 DIAGNOSIS — N63 Unspecified lump in unspecified breast: Secondary | ICD-10-CM | POA: Diagnosis present

## 2016-06-25 ENCOUNTER — Other Ambulatory Visit: Payer: Self-pay | Admitting: Family Medicine

## 2016-06-25 DIAGNOSIS — N6001 Solitary cyst of right breast: Secondary | ICD-10-CM

## 2016-06-29 ENCOUNTER — Ambulatory Visit: Payer: Medicare Other

## 2016-06-29 ENCOUNTER — Other Ambulatory Visit: Payer: Medicare Other

## 2016-07-12 ENCOUNTER — Ambulatory Visit
Admission: RE | Admit: 2016-07-12 | Discharge: 2016-07-12 | Disposition: A | Discharge status: Home / Self Care | Payer: Medicare HMO | Source: Ambulatory Visit | Attending: Family Medicine | Admitting: Family Medicine

## 2016-07-12 DIAGNOSIS — N6001 Solitary cyst of right breast: Secondary | ICD-10-CM

## 2016-07-12 HISTORY — PX: BREAST CYST ASPIRATION: SHX578

## 2017-06-13 ENCOUNTER — Other Ambulatory Visit: Payer: Self-pay | Admitting: Family Medicine

## 2017-06-13 DIAGNOSIS — Z1231 Encounter for screening mammogram for malignant neoplasm of breast: Secondary | ICD-10-CM

## 2017-07-02 ENCOUNTER — Emergency Department: Payer: MEDICARE

## 2017-07-02 ENCOUNTER — Inpatient Hospital Stay
Admission: EM | Admit: 2017-07-02 | Discharge: 2017-07-02 | DRG: 310 | Disposition: A | Discharge status: Home / Self Care | Payer: MEDICARE | Attending: Internal Medicine | Admitting: Internal Medicine

## 2017-07-02 ENCOUNTER — Other Ambulatory Visit: Payer: Self-pay

## 2017-07-02 ENCOUNTER — Inpatient Hospital Stay (HOSPITAL_COMMUNITY)
Admit: 2017-07-02 | Discharge: 2017-07-02 | Disposition: A | Discharge status: Home / Self Care | Payer: MEDICARE | Attending: Internal Medicine | Admitting: Internal Medicine

## 2017-07-02 DIAGNOSIS — K219 Gastro-esophageal reflux disease without esophagitis: Secondary | ICD-10-CM | POA: Diagnosis not present

## 2017-07-02 DIAGNOSIS — Z803 Family history of malignant neoplasm of breast: Secondary | ICD-10-CM

## 2017-07-02 DIAGNOSIS — Z961 Presence of intraocular lens: Secondary | ICD-10-CM | POA: Diagnosis present

## 2017-07-02 DIAGNOSIS — I341 Nonrheumatic mitral (valve) prolapse: Secondary | ICD-10-CM | POA: Diagnosis not present

## 2017-07-02 DIAGNOSIS — E7800 Pure hypercholesterolemia, unspecified: Secondary | ICD-10-CM | POA: Diagnosis present

## 2017-07-02 DIAGNOSIS — I480 Paroxysmal atrial fibrillation: Secondary | ICD-10-CM | POA: Diagnosis not present

## 2017-07-02 DIAGNOSIS — Z880 Allergy status to penicillin: Secondary | ICD-10-CM | POA: Diagnosis not present

## 2017-07-02 DIAGNOSIS — I10 Essential (primary) hypertension: Secondary | ICD-10-CM | POA: Diagnosis not present

## 2017-07-02 DIAGNOSIS — I4891 Unspecified atrial fibrillation: Secondary | ICD-10-CM

## 2017-07-02 DIAGNOSIS — Z882 Allergy status to sulfonamides status: Secondary | ICD-10-CM

## 2017-07-02 DIAGNOSIS — M19049 Primary osteoarthritis, unspecified hand: Secondary | ICD-10-CM | POA: Diagnosis present

## 2017-07-02 DIAGNOSIS — Z9842 Cataract extraction status, left eye: Secondary | ICD-10-CM | POA: Diagnosis not present

## 2017-07-02 DIAGNOSIS — R9431 Abnormal electrocardiogram [ECG] [EKG]: Secondary | ICD-10-CM

## 2017-07-02 DIAGNOSIS — E785 Hyperlipidemia, unspecified: Secondary | ICD-10-CM | POA: Diagnosis present

## 2017-07-02 DIAGNOSIS — Z87442 Personal history of urinary calculi: Secondary | ICD-10-CM | POA: Diagnosis not present

## 2017-07-02 DIAGNOSIS — J309 Allergic rhinitis, unspecified: Secondary | ICD-10-CM | POA: Diagnosis not present

## 2017-07-02 DIAGNOSIS — Z881 Allergy status to other antibiotic agents status: Secondary | ICD-10-CM

## 2017-07-02 DIAGNOSIS — Z9841 Cataract extraction status, right eye: Secondary | ICD-10-CM

## 2017-07-02 DIAGNOSIS — R002 Palpitations: Secondary | ICD-10-CM | POA: Diagnosis present

## 2017-07-02 HISTORY — DX: Paroxysmal atrial fibrillation: I48.0

## 2017-07-02 LAB — HEPATIC FUNCTION PANEL
ALBUMIN: 4.3 g/dL (ref 3.5–5.0)
ALK PHOS: 85 U/L (ref 38–126)
ALT: 16 U/L (ref 14–54)
AST: 29 U/L (ref 15–41)
BILIRUBIN TOTAL: 1.1 mg/dL (ref 0.3–1.2)
Total Protein: 6.7 g/dL (ref 6.5–8.1)

## 2017-07-02 LAB — PROTIME-INR
INR: 0.98
Prothrombin Time: 12.9 seconds (ref 11.4–15.2)

## 2017-07-02 LAB — ECHOCARDIOGRAM COMPLETE
Height: 66 in
Weight: 2638.4 oz

## 2017-07-02 LAB — TSH: TSH: 4.161 u[IU]/mL (ref 0.350–4.500)

## 2017-07-02 LAB — CBC
HCT: 41.8 % (ref 35.0–47.0)
Hemoglobin: 14.3 g/dL (ref 12.0–16.0)
MCH: 31.5 pg (ref 26.0–34.0)
MCHC: 34.4 g/dL (ref 32.0–36.0)
MCV: 91.5 fL (ref 80.0–100.0)
PLATELETS: 145 10*3/uL — AB (ref 150–440)
RBC: 4.56 MIL/uL (ref 3.80–5.20)
RDW: 13 % (ref 11.5–14.5)
WBC: 4.8 10*3/uL (ref 3.6–11.0)

## 2017-07-02 LAB — TROPONIN I
Troponin I: <0.03 ng/mL (ref <0.03)
Troponin I: <0.03 ng/mL (ref <0.03)

## 2017-07-02 LAB — LIPID PANEL
CHOLESTEROL: 193 mg/dL (ref 0–200)
HDL: 91 mg/dL (ref >40)
LDL Cholesterol: 78 mg/dL (ref 0–99)
TRIGLYCERIDES: 120 mg/dL (ref <150)
Total CHOL/HDL Ratio: 2.1 RATIO
VLDL: 24 mg/dL (ref 0–40)

## 2017-07-02 LAB — BASIC METABOLIC PANEL
Anion gap: 8 (ref 5–15)
BUN: 26 mg/dL — AB (ref 6–20)
CALCIUM: 10 mg/dL (ref 8.9–10.3)
CHLORIDE: 107 mmol/L (ref 101–111)
CO2: 26 mmol/L (ref 22–32)
CREATININE: 0.79 mg/dL (ref 0.44–1.00)
GFR calc Af Amer: >60 mL/min (ref >60)
GFR calc non Af Amer: >60 mL/min (ref >60)
GLUCOSE: 98 mg/dL (ref 65–99)
Potassium: 3.9 mmol/L (ref 3.5–5.1)
Sodium: 141 mmol/L (ref 135–145)

## 2017-07-02 LAB — MAGNESIUM: MAGNESIUM: 1.7 mg/dL (ref 1.7–2.4)

## 2017-07-02 LAB — APTT: aPTT: 35 seconds (ref 24–36)

## 2017-07-02 MED ORDER — DILTIAZEM HCL 25 MG/5ML IV SOLN
INTRAVENOUS | Status: AC
  Administered 2017-07-02: 10 mg via INTRAVENOUS
  Filled 2017-07-02: qty 5

## 2017-07-02 MED ORDER — DILTIAZEM HCL 25 MG/5ML IV SOLN
10.0000 mg | Freq: Once | INTRAVENOUS | Status: AC
  Administered 2017-07-02: 10 mg via INTRAVENOUS
  Filled 2017-07-02: qty 5

## 2017-07-02 MED ORDER — POTASSIUM CHLORIDE CRYS ER 20 MEQ PO TBCR
20.0000 meq | EXTENDED_RELEASE_TABLET | Freq: Once | ORAL | Status: AC
  Administered 2017-07-02: 20 meq via ORAL
  Filled 2017-07-02: qty 1

## 2017-07-02 MED ORDER — ONDANSETRON HCL 4 MG/2ML IJ SOLN: 4.0000 mg | Freq: Four times a day (QID) | INTRAMUSCULAR | Status: DC | PRN

## 2017-07-02 MED ORDER — SODIUM CHLORIDE 0.9 % IV BOLUS (SEPSIS)
1000.0000 mL | Freq: Once | INTRAVENOUS | Status: AC
  Administered 2017-07-02: 1000 mL via INTRAVENOUS

## 2017-07-02 MED ORDER — METOPROLOL SUCCINATE ER 25 MG PO TB24: 75.0000 mg | ORAL_TABLET | Freq: Every day | ORAL | 0 refills | Status: DC

## 2017-07-02 MED ORDER — LORATADINE 10 MG PO TABS
10.0000 mg | ORAL_TABLET | Freq: Every day | ORAL | Status: DC
  Administered 2017-07-02: 10 mg via ORAL
  Filled 2017-07-02: qty 1

## 2017-07-02 MED ORDER — ADULT MULTIVITAMIN W/MINERALS CH
1.0000 | ORAL_TABLET | Freq: Every day | ORAL | Status: DC
  Filled 2017-07-02: qty 1

## 2017-07-02 MED ORDER — HYDROCHLOROTHIAZIDE 25 MG PO TABS
12.5000 mg | ORAL_TABLET | Freq: Every day | ORAL | Status: DC
  Administered 2017-07-02: 12.5 mg via ORAL
  Filled 2017-07-02: qty 1

## 2017-07-02 MED ORDER — DILTIAZEM HCL 25 MG/5ML IV SOLN
10.0000 mg | Freq: Once | INTRAVENOUS | Status: AC
  Administered 2017-07-02: 10 mg via INTRAVENOUS

## 2017-07-02 MED ORDER — DILTIAZEM HCL 100 MG IV SOLR
5.0000 mg/h | Freq: Once | INTRAVENOUS | Status: DC
  Filled 2017-07-02: qty 100

## 2017-07-02 MED ORDER — DOCUSATE SODIUM 100 MG PO CAPS
100.0000 mg | ORAL_CAPSULE | Freq: Two times a day (BID) | ORAL | Status: DC
  Filled 2017-07-02: qty 1

## 2017-07-02 MED ORDER — POTASSIUM CHLORIDE ER 10 MEQ PO TBCR: 10.0000 meq | EXTENDED_RELEASE_TABLET | Freq: Every day | ORAL | 0 refills | Status: DC

## 2017-07-02 MED ORDER — DILTIAZEM HCL 60 MG PO TABS
60.0000 mg | ORAL_TABLET | Freq: Once | ORAL | Status: AC
  Administered 2017-07-02: 60 mg via ORAL
  Filled 2017-07-02: qty 1

## 2017-07-02 MED ORDER — MAGNESIUM OXIDE 400 (241.3 MG) MG PO TABS
400.0000 mg | ORAL_TABLET | Freq: Two times a day (BID) | ORAL | Status: DC
Start: 2017-07-02 — End: 2017-07-02
  Administered 2017-07-02: 400 mg via ORAL
  Filled 2017-07-02: qty 1

## 2017-07-02 MED ORDER — SODIUM CHLORIDE 0.9 % IV BOLUS (SEPSIS): 1000.0000 mL | Freq: Once | INTRAVENOUS | Status: DC

## 2017-07-02 MED ORDER — ACETAMINOPHEN 325 MG PO TABS: 650.0000 mg | ORAL_TABLET | Freq: Four times a day (QID) | ORAL | Status: DC | PRN

## 2017-07-02 MED ORDER — METOPROLOL SUCCINATE ER 50 MG PO TB24
50.0000 mg | ORAL_TABLET | Freq: Every day | ORAL | Status: DC
  Administered 2017-07-02: 50 mg via ORAL
  Filled 2017-07-02: qty 1

## 2017-07-02 MED ORDER — APIXABAN 5 MG PO TABS: 5.0000 mg | ORAL_TABLET | Freq: Two times a day (BID) | ORAL | 0 refills | Status: DC

## 2017-07-02 MED ORDER — CALCIUM-VITAMIN D 500-200 MG-UNIT PO TABS
2.0000 | ORAL_TABLET | Freq: Every day | ORAL | Status: DC
  Filled 2017-07-02: qty 2

## 2017-07-02 MED ORDER — MAGNESIUM OXIDE -MG SUPPLEMENT 400 (240 MG) MG PO TABS: 1.0000 | ORAL_TABLET | Freq: Every day | ORAL | 0 refills | Status: AC

## 2017-07-02 MED ORDER — ATORVASTATIN CALCIUM 20 MG PO TABS
10.0000 mg | ORAL_TABLET | Freq: Every evening | ORAL | Status: DC
  Administered 2017-07-02: 10 mg via ORAL
  Filled 2017-07-02: qty 1

## 2017-07-02 MED ORDER — NAPROXEN SODIUM 275 MG PO TABS
275.0000 mg | ORAL_TABLET | Freq: Two times a day (BID) | ORAL | Status: DC | PRN
  Filled 2017-07-02: qty 1

## 2017-07-02 MED ORDER — ACETAMINOPHEN 650 MG RE SUPP: 650.0000 mg | Freq: Four times a day (QID) | RECTAL | Status: DC | PRN

## 2017-07-02 MED ORDER — ONDANSETRON HCL 4 MG PO TABS: 4.0000 mg | ORAL_TABLET | Freq: Four times a day (QID) | ORAL | Status: DC | PRN

## 2017-07-02 MED ORDER — APIXABAN 5 MG PO TABS
5.0000 mg | ORAL_TABLET | Freq: Two times a day (BID) | ORAL | Status: DC
  Administered 2017-07-02 (×2): 5 mg via ORAL
  Filled 2017-07-02 (×2): qty 2

## 2017-07-02 MED ORDER — SACCHAROMYCES BOULARDII 250 MG PO CAPS
250.0000 mg | ORAL_CAPSULE | Freq: Two times a day (BID) | ORAL | Status: DC
  Administered 2017-07-02: 250 mg via ORAL
  Filled 2017-07-02 (×2): qty 1

## 2017-07-02 NOTE — ED Triage Notes (Signed)
Patient reports that she awoke this evening with palpitations. Patient reports highest heart rate at home was 138.  Patient denies dizzy/lightheadedness, nausea. Patient c/o weakness.  Patient denies nausea, SOB.

## 2017-07-02 NOTE — ED Notes (Signed)
Xray at the bedside.

## 2017-07-02 NOTE — Progress Notes (Signed)
Pt discharged home via wheelchair, with husband. No complaints at this time, VSS, prescriptions have been called in, and pt. Is educated and verifies understanding of new medications, and medication dosage changes. Patient will call to schedule follow up with Dr.End.

## 2017-07-02 NOTE — H&P (Signed)
Carly Beltran is an 71 y.o. female.   Chief Complaint: Palpitations HPI: Patient with past medical history of hypertension, mitral valve prolapse and hyperlipidemia presents to the emergency department complaining of palpitations.  She states that she was awoken from sleep with the sensation of her heart racing.  She checked her pulse which she found to be very high.  She denies lightheadedness, shortness of breath, nausea, vomiting or diaphoresis.  In the emergency department she was found have atrial fibrillation with rapid ventricular rate.  She was started on a diltiazem drip which eventually converted the patient to normal sinus rhythm.  She was admitted to the hospitalist service for further evaluation.  Past Medical History:  Diagnosis Date  . Arthritis    1 finger  . Cough    due to reflux  . Dysrhythmia    palpatations (occasionally)  . Environmental allergies   . GERD (gastroesophageal reflux disease)   . Hypercholesteremia   . Hypertension   . Kidney stones   . Motion sickness    Reading in car  . MVP (mitral valve prolapse)    followed by PCP    Past Surgical History:  Procedure Laterality Date  . CATARACT EXTRACTION PHACO AND INTRAOCULAR LENS PLACEMENT (IOC) Left 09/12/2015   Performed by Ronnell Freshwater, MD at [place]  . CATARACT EXTRACTION PHACO AND INTRAOCULAR LENS PLACEMENT (IOC) Right 07/04/2015   Performed by Ronnell Freshwater, MD at [place]  . COLONOSCOPY    . COLONOSCOPY WITH PROPOFOL N/A 12/23/2015   Performed by Manya Silvas, MD at [place]  . DILATION AND CURETTAGE OF UTERUS    . ESOPHAGOGASTRODUODENOSCOPY    . TOE SURGERY     corns removed    Family History  Problem Relation Age of Onset  . Breast cancer Cousin    Social History:  reports that  has never smoked. she has never used smokeless tobacco. She reports that she drinks about 0.6 oz of alcohol per week. She reports that she does not use  drugs.  Allergies:  Allergies  Allergen Reactions  . Ampicillin Rash  . Penicillins Rash    Has patient had a PCN reaction causing immediate rash, facial/tongue/throat swelling, SOB or lightheadedness with hypotension: No Has patient had a PCN reaction causing severe rash involving mucus membranes or skin necrosis: No Has patient had a PCN reaction that required hospitalization: No Has patient had a PCN reaction occurring within the last 10 years: No If all of the above answers are "NO", then may proceed with Cephalosporin use.   Sarina Ill [Sulfamethoxazole-Trimethoprim] Rash    Medications Prior to Admission  Medication Sig Dispense Refill  . atorvastatin (LIPITOR) 10 MG tablet Take 10 mg every evening by mouth.    . Calcium Carb-Cholecalciferol (CALCIUM-VITAMIN D) 600-400 MG-UNIT TABS Take 2 tablets daily by mouth.    . cetirizine (ZYRTEC) 10 MG tablet Take 10 mg daily by mouth.    . dextromethorphan-guaiFENesin (MUCINEX DM) 30-600 MG 12hr tablet Take 1 tablet 2 (two) times daily by mouth.    . hydrochlorothiazide (HYDRODIURIL) 12.5 MG tablet Take 12.5 mg daily by mouth.     . metoprolol succinate (TOPROL-XL) 50 MG 24 hr tablet Take 50 mg by mouth daily. Take with or immediately following a meal.    . Multiple Vitamin (MULTIVITAMIN) tablet Take 1 tablet by mouth daily. Reported on 09/12/2015    . naproxen sodium (ALEVE) 220 MG tablet Take 220 mg 2 (two)  times daily as needed by mouth (pain).    . Probiotic Product (PROBIOTIC DAILY PO) Take by mouth.    . saccharomyces boulardii (FLORASTOR) 250 MG capsule Take 250 mg by mouth 2 (two) times daily.      Results for orders placed or performed during the hospital encounter of 07/02/17 (from the past 48 hour(s))  Basic metabolic panel     Status: Abnormal   Collection Time: 07/02/17  1:43 AM  Result Value Ref Range   Sodium 141 135 - 145 mmol/L   Potassium 3.9 3.5 - 5.1 mmol/L   Chloride 107 101 - 111 mmol/L   CO2 26 22 - 32 mmol/L    Glucose, Bld 98 65 - 99 mg/dL   BUN 26 (H) 6 - 20 mg/dL   Creatinine, Ser 0.79 0.44 - 1.00 mg/dL   Calcium 10.0 8.9 - 10.3 mg/dL   GFR calc non Af Amer >60 >60 mL/min   GFR calc Af Amer >60 >60 mL/min    Comment: (NOTE) The eGFR has been calculated using the CKD EPI equation. This calculation has not been validated in all clinical situations. eGFR's persistently <60 mL/min signify possible Chronic Kidney Disease.    Anion gap 8 5 - 15  CBC     Status: Abnormal   Collection Time: 07/02/17  1:43 AM  Result Value Ref Range   WBC 4.8 3.6 - 11.0 K/uL   RBC 4.56 3.80 - 5.20 MIL/uL   Hemoglobin 14.3 12.0 - 16.0 g/dL   HCT 41.8 35.0 - 47.0 %   MCV 91.5 80.0 - 100.0 fL   MCH 31.5 26.0 - 34.0 pg   MCHC 34.4 32.0 - 36.0 g/dL   RDW 13.0 11.5 - 14.5 %   Platelets 145 (L) 150 - 440 K/uL  Troponin I     Status: None   Collection Time: 07/02/17  1:43 AM  Result Value Ref Range   Troponin I <0.03 <0.03 ng/mL  APTT     Status: None   Collection Time: 07/02/17  1:43 AM  Result Value Ref Range   aPTT 35 24 - 36 seconds  Protime-INR     Status: None   Collection Time: 07/02/17  1:43 AM  Result Value Ref Range   Prothrombin Time 12.9 11.4 - 15.2 seconds   INR 0.98   TSH     Status: None   Collection Time: 07/02/17  1:48 AM  Result Value Ref Range   TSH 4.161 0.350 - 4.500 uIU/mL    Comment: Performed by a 3rd Generation assay with a functional sensitivity of <=0.01 uIU/mL.   Dg Chest Port 1 View  Result Date: 07/02/2017 CLINICAL DATA:  82-year-old female with palpitation. EXAM: PORTABLE CHEST 1 VIEW COMPARISON:  Chest CT dated 08/30/2008 FINDINGS: The heart size and mediastinal contours are within normal limits. Both lungs are clear. The visualized skeletal structures are unremarkable. IMPRESSION: No active disease. Electronically Signed   By: Anner Crete M.D.   On: 07/02/2017 02:39    Review of Systems  Constitutional: Negative for chills and fever.  HENT: Negative for sore throat  and tinnitus.   Eyes: Negative for blurred vision and redness.  Respiratory: Negative for cough and shortness of breath.   Cardiovascular: Positive for palpitations. Negative for chest pain, orthopnea and PND.  Gastrointestinal: Negative for abdominal pain, diarrhea, nausea and vomiting.  Genitourinary: Negative for dysuria, frequency and urgency.  Musculoskeletal: Negative for joint pain and myalgias.  Skin: Negative for rash.  No lesions  Neurological: Negative for speech change, focal weakness and weakness.  Endo/Heme/Allergies: Does not bruise/bleed easily.       No temperature intolerance  Psychiatric/Behavioral: Negative for depression and suicidal ideas.    Blood pressure 134/66, pulse 65, temperature 98.2 F (36.8 C), temperature source Oral, resp. rate 18, height '5\' 6"'  (1.676 m), weight 74.8 kg (164 lb 14.4 oz), SpO2 97 %. Physical Exam  Vitals reviewed. Constitutional: She is oriented to person, place, and time. She appears well-developed and well-nourished. No distress.  HENT:  Head: Normocephalic and atraumatic.  Mouth/Throat: Oropharynx is clear and moist.  Eyes: Conjunctivae and EOM are normal. Pupils are equal, round, and reactive to light. No scleral icterus.  Neck: Normal range of motion. Neck supple. No JVD present. No tracheal deviation present. No thyromegaly present.  Cardiovascular: Normal rate, regular rhythm and normal heart sounds. Exam reveals no gallop and no friction rub.  No murmur heard. Respiratory: Effort normal and breath sounds normal.  GI: Soft. Bowel sounds are normal. She exhibits no distension. There is no tenderness.  Genitourinary:  Genitourinary Comments: Deferred  Musculoskeletal: Normal range of motion. She exhibits no edema.  Lymphadenopathy:    She has no cervical adenopathy.  Neurological: She is alert and oriented to person, place, and time. No cranial nerve deficit. She exhibits normal muscle tone.  Skin: Skin is warm and dry. No  rash noted. No erythema.  Psychiatric: She has a normal mood and affect. Her behavior is normal. Judgment and thought content normal.     Assessment/Plan This is a 71 year old female admitted for atrial fibrillation with rapid ventricular rate. 1.  Atrial fibrillation: With rapid ventricular rate; converted to normal sinus after diltiazem drip.  She has been given overlapping oral diltiazem as well.  Continue to monitor telemetry.  Cardiology consult ordered.  Obtain echocardiogram.  Mali score 1 but CHAD-Vas score 3 indicating need for anticoagulation.  I have started the patient on Eliquis. 2.  Hypertension: Controlled; continue metoprolol for now (diltiazem may be a more suitable control agent) 3.  Hyperlipidemia: Continue statin therapy 4.  Allergic rhinitis: Continue antihistamine 5.  DVT prophylaxis: Full dose anticoagulation 6.  GI prophylaxis: None The patient is a full code.  Time spent on admission exam patient care approximately 45 minutes  Harrie Foreman, MD 07/02/2017, 7:07 AM

## 2017-07-02 NOTE — ED Notes (Signed)
Pt up to restroom with steady gait. No distress noted at this time.    

## 2017-07-02 NOTE — Progress Notes (Signed)
*  PRELIMINARY RESULTS* Echocardiogram 2D Echocardiogram has been performed.  Carly Beltran 07/02/2017, 3:46 PM

## 2017-07-02 NOTE — ED Notes (Signed)
Dr. Brown at the bedside

## 2017-07-02 NOTE — Progress Notes (Signed)
ANTICOAGULATION CONSULT NOTE - Initial Consult  Pharmacy Consult for apixaban Indication: atrial fibrillation  Allergies  Allergen Reactions  . Ampicillin Rash  . Penicillins Rash    Has patient had a PCN reaction causing immediate rash, facial/tongue/throat swelling, SOB or lightheadedness with hypotension: No Has patient had a PCN reaction causing severe rash involving mucus membranes or skin necrosis: No Has patient had a PCN reaction that required hospitalization: No Has patient had a PCN reaction occurring within the last 10 years: No If all of the above answers are "NO", then may proceed with Cephalosporin use.   Sarina Ill [Sulfamethoxazole-Trimethoprim] Rash    Patient Measurements: Height: 5\' 6"  (167.6 cm) Weight: 164 lb 14.4 oz (74.8 kg) IBW/kg (Calculated) : 59.3 Heparin Dosing Weight: 74.8 kg  Vital Signs: Temp: 98.2 F (36.8 C) (11/20 0531) Temp Source: Oral (11/20 0531) BP: 134/66 (11/20 0531) Pulse Rate: 65 (11/20 0531)  Labs: Recent Labs    07/02/17 0143  HGB 14.3  HCT 41.8  PLT 145*  APTT 35  LABPROT 12.9  INR 0.98  CREATININE 0.79  TROPONINI <0.03    Estimated Creatinine Clearance: 67.7 mL/min (by C-G formula based on SCr of 0.79 mg/dL).   Medical History: Past Medical History:  Diagnosis Date  . Arthritis    1 finger  . Cough    due to reflux  . Dysrhythmia    palpatations (occasionally)  . Environmental allergies   . GERD (gastroesophageal reflux disease)   . Hypercholesteremia   . Hypertension   . Kidney stones   . Motion sickness    Reading in car  . MVP (mitral valve prolapse)    followed by PCP    Medications:  Scheduled:  . apixaban  5 mg Oral BID  . atorvastatin  10 mg Oral QPM  . calcium-vitamin D  2 tablet Oral Daily  . docusate sodium  100 mg Oral BID  . hydrochlorothiazide  12.5 mg Oral Daily  . loratadine  10 mg Oral Daily  . metoprolol succinate  50 mg Oral Daily  . multivitamin with minerals  1 tablet Oral  Daily  . saccharomyces boulardii  250 mg Oral BID    Assessment: Patient admitted w/ palpitations. Pt states her home monitor read a pulse of 138. Has a h/o occasional palpitations and MVP mitral valve prolapse. On EKG shown to be in afib w/ RVR CHADS2-VASc = 4 warranting anticoagulation Baseline labs (aPTT, pt/INR WNL)  Goal of Therapy:  Monitor platelets by anticoagulation protocol: Yes   Plan:  Considering patient does NOT meet apixaban dose adjustment criteria (ie age > 73 yrs, wt < 60 kg, and/or Scr > 1.5 mg/dL): Will start patient on apixaban 5 mg daily for anticoagulation for afib. Will monitor for s/sx of bleed.  Tobie Lords, PharmD, BCPS Clinical Pharmacist 07/02/2017

## 2017-07-02 NOTE — ED Provider Notes (Signed)
[facility] Emergency Department Provider Note    First MD Initiated Contact with Patient 07/02/17 0143     (approximate)  I have reviewed the triage vital signs and the nursing notes.   HISTORY  Chief Complaint Palpitations   HPI Carly Beltran is a 71 y.o. female with below list of chronic medical conditions presents emergency department with acute onset of palpitations on awakening this morning.  Patient states heart rate monitor at home revealed a heart rate of 138 and variable.  Patient admits to dizziness and lightheadedness, nausea, generalized weakness.  Patient denies any chest pain or shortness of breath.   Past Medical History:  Diagnosis Date  . Arthritis    1 finger  . Cough    due to reflux  . Dysrhythmia    palpatations (occasionally)  . Environmental allergies   . GERD (gastroesophageal reflux disease)   . Hypercholesteremia   . Hypertension   . Kidney stones   . Motion sickness    Reading in car  . MVP (mitral valve prolapse)    followed by PCP    Patient Active Problem List   Diagnosis Date Noted  . Atrial fibrillation with RVR ([place]) 07/02/2017    Past Surgical History:  Procedure Laterality Date  . CATARACT EXTRACTION PHACO AND INTRAOCULAR LENS PLACEMENT (IOC) Left 09/12/2015   Performed by Ronnell Freshwater, MD at [place]  . CATARACT EXTRACTION PHACO AND INTRAOCULAR LENS PLACEMENT (IOC) Right 07/04/2015   Performed by Ronnell Freshwater, MD at [place]  . COLONOSCOPY    . COLONOSCOPY WITH PROPOFOL N/A 12/23/2015   Performed by Manya Silvas, MD at [place]  . DILATION AND CURETTAGE OF UTERUS    . ESOPHAGOGASTRODUODENOSCOPY    . TOE SURGERY     corns removed    Prior to Admission medications   Medication Sig Start Date End Date Taking? Authorizing Provider  atorvastatin (LIPITOR) 10 MG tablet Take 10 mg every evening by mouth. 03/30/17  Yes [provider]    Calcium Carb-Cholecalciferol (CALCIUM-VITAMIN D) 600-400 MG-UNIT TABS Take 2 tablets daily by mouth.   Yes [provider]  cetirizine (ZYRTEC) 10 MG tablet Take 10 mg daily by mouth.   Yes [provider]  dextromethorphan-guaiFENesin (MUCINEX DM) 30-600 MG 12hr tablet Take 1 tablet 2 (two) times daily by mouth.   Yes [provider]  hydrochlorothiazide (HYDRODIURIL) 12.5 MG tablet Take 12.5 mg daily by mouth.    Yes [provider]  metoprolol succinate (TOPROL-XL) 50 MG 24 hr tablet Take 50 mg by mouth daily. Take with or immediately following a meal.   Yes [provider]  Multiple Vitamin (MULTIVITAMIN) tablet Take 1 tablet by mouth daily. Reported on 09/12/2015   Yes [provider]  naproxen sodium (ALEVE) 220 MG tablet Take 220 mg 2 (two) times daily as needed by mouth (pain).   Yes [provider]  Probiotic Product (PROBIOTIC DAILY PO) Take by mouth.   Yes [provider]  saccharomyces boulardii (FLORASTOR) 250 MG capsule Take 250 mg by mouth 2 (two) times daily.   Yes [provider]    Allergies Ampicillin; Penicillins; and Septra [sulfamethoxazole-trimethoprim]  Family History  Problem Relation Age of Onset  . Breast cancer Cousin     Social History Social History   Tobacco Use  . Smoking status: Never Smoker  . Smokeless tobacco: Never Used  Substance Use Topics  . Alcohol use: Yes  Alcohol/week: 0.6 oz    Types: 1 Glasses of wine per week  . Drug use: No    Review of Systems Constitutional: No fever/chills Eyes: No visual changes. ENT: No sore throat. Cardiovascular: Denies chest pain.  Positive for palpitations Respiratory: Denies shortness of breath. Gastrointestinal: No abdominal pain.  No nausea, no vomiting.  No diarrhea.  No constipation. Genitourinary: Negative for dysuria. Musculoskeletal: Negative for neck pain.  Negative for back pain. Integumentary: Negative for  rash. Neurological: Negative for headaches, focal weakness or numbness.  ____________________________________________   PHYSICAL EXAM:  VITAL SIGNS: ED Triage Vitals  Enc Vitals Group     BP 07/02/17 0150 (!) 189/114     Pulse Rate 07/02/17 0150 (!) 143     Resp 07/02/17 0150 20     Temp --      Temp src --      SpO2 07/02/17 0150 97 %     Weight 07/02/17 0137 76.2 kg (168 lb)     Height --      Head Circumference --      Peak Flow --      Pain Score 07/02/17 0137 1     Pain Loc --      Pain Edu? --      Excl. in [place]? --     Constitutional: Alert and oriented. Well appearing and in no acute distress. Eyes: Conjunctivae are normal.  Head: Atraumatic. Mouth/Throat: Mucous membranes are moist.  Oropharynx non-erythematous. Neck: No stridor.   Cardiovascular: Tachycardia, irregular rhythm good peripheral circulation. Grossly normal heart sounds. Respiratory: Normal respiratory effort.  No retractions. Lungs CTAB. Gastrointestinal: Soft and nontender. No distention.  Musculoskeletal: No lower extremity tenderness nor edema. No gross deformities of extremities. Neurologic:  Normal speech and language. No gross focal neurologic deficits are appreciated.  Skin:  Skin is warm, dry and intact. No rash noted. Psychiatric: Mood and affect are normal. Speech and behavior are normal.  ____________________________________________   LABS (all labs ordered are listed, but only abnormal results are displayed)  Labs Reviewed  BASIC METABOLIC PANEL - Abnormal; Notable for the following components:      Result Value   BUN 26 (*)    All other components within normal limits  CBC - Abnormal; Notable for the following components:   Platelets 145 (*)    All other components within normal limits  TROPONIN I  APTT  PROTIME-INR   ____________________________________________  EKG  Time: 1:37 AM Rate:151 Rhythm: Atrial fibrillation with rapid ventricular response Axis:  Normal Interval: Normal ST segment and T wave: ST segment and T wave inversion inferiorly.  ____________________________________________  RADIOLOGY I, Gregor Hams, personally viewed and evaluated these images (plain radiographs) as part of my medical decision making, as well as reviewing the written report by the radiologist.  Dg Chest Port 1 View  Result Date: 07/02/2017 CLINICAL DATA:  41-year-old female with palpitation. EXAM: PORTABLE CHEST 1 VIEW COMPARISON:  Chest CT dated 08/30/2008 FINDINGS: The heart size and mediastinal contours are within normal limits. Both lungs are clear. The visualized skeletal structures are unremarkable. IMPRESSION: No active disease. Electronically Signed   By: Anner Crete M.D.   On: 07/02/2017 02:39    ____________________________________________   PROCEDURES  Critical Care performed: CRITICAL CARE Performed by: Gregor Hams   Total critical care time: 30 minutes  Critical care time was exclusive of separately billable procedures and treating other patients.  Critical care was necessary to treat or prevent imminent or  life-threatening deterioration.  Critical care was time spent personally by me on the following activities: development of treatment plan with patient and/or surrogate as well as nursing, discussions with consultants, evaluation of patient's response to treatment, examination of patient, obtaining history from patient or surrogate, ordering and performing treatments and interventions, ordering and review of laboratory studies, ordering and review of radiographic studies, pulse oximetry and re-evaluation of patient's condition.   Procedures   ____________________________________________   INITIAL IMPRESSION / ASSESSMENT AND PLAN / ED COURSE  As part of my medical decision making, I reviewed the following data within the electronic MEDICAL RECORD [id number] year old female presented with above-stated echo exam consistent  with atrial fibrillation rapid ventricular response.  Patient given diltiazem 20 mg IV bolus followed by an additional IV bolus with continued atrial fibrillation with rapid ventricular response although rate improved.  As such diltiazem infusion initiated ____________________________________________  FINAL CLINICAL IMPRESSION(S) / ED DIAGNOSES  Final diagnoses:  Atrial fibrillation with rapid ventricular response (HCC)     MEDICATIONS GIVEN DURING THIS VISIT:  Medications  diltiazem (CARDIZEM) 100 mg in dextrose 5 % 100 mL (1 mg/mL) infusion (0 mg/hr Intravenous Hold 07/02/17 0321)  apixaban (ELIQUIS) tablet 5 mg (not administered)  diltiazem (CARDIZEM) injection 10 mg (10 mg Intravenous Given 07/02/17 0154)  diltiazem (CARDIZEM) injection 10 mg (10 mg Intravenous Given 07/02/17 0148)  sodium chloride 0.9 % bolus 1,000 mL (1,000 mLs Intravenous New Bag/Given 07/02/17 0217)  diltiazem (CARDIZEM) injection 10 mg (10 mg Intravenous Given 07/02/17 0218)  diltiazem (CARDIZEM) tablet 60 mg (60 mg Oral Given 07/02/17 0226)     ED Discharge Orders    None       Note:  This document was prepared using Dragon voice recognition software and may include unintentional dictation errors.    Gregor Hams, MD 07/02/17 [phone]

## 2017-07-02 NOTE — Consult Note (Signed)
Cardiology Consultation:   Patient ID: Carly Beltran; [id number]; 1946-03-31   Admit date: 07/02/2017 Date of Consult: 07/02/2017  Primary Care Provider: Dion Body, MD Primary Cardiologist: New to [facility] - consult by End   Patient Profile:   Carly Beltran is a 71 y.o. female with a hx of HTN, HLD, MVP, palpitations, kidney stones, and GERD who is being seen today for the evaluation of new onset Afib with RVR at the request of Dr. Marcille Blanco, MD.  History of Present Illness:   Carly Beltran has a known history palpitations dating back many years in which she has previously worn a Holter monitor in 1999 that showed PVCs per her report, otherwise no evidence of arrhythmia. She has described a history of palpitations over the years that would last several beats and self resolve. These episodes seemed to improve with initiation of Toprol XL, though over the past several months she had noticed an increase in frequency of her palpitations. She has also noted increased diarrhea that was attributed to PPI usage. No recent illness or vomiting. Husband does report the patient snores, though she denies any daytime somnolence. Patient drinks 2 cups of half-caffeine coffee in the mornings and maybe a glass of tea in the evening. She has never been a smoker and denies any illegal drug abuse.   She was in her usual state of health when she went to bed on 11/19. She was woken up suddenly in the middle of the night with palpitations. She took her BP and heart rate with her automated BP cuff with a reported reading of 932I systolic and a heart rate of 138 bpm. Her palpitations persisted for about an hour at home without improvement, which was new for her. There was no associated chest pain, SOB, diaphoresis, nausea, vomiting, dizziness, presyncope, or syncope. No recent change in her chronic cough, PND, lower extremity swelling, orthopnea or early satiety. Because her symptoms persisted she came to  [facility]    Upon the patient's arrival to [facility] they were found to be in new onset Afib with RVR with heart rates in the 150s bpm. BP 189/114 that has since improved to 120/70, HR 151 bpm, temp 98.2, oxygen saturation 97% on room air, weight 168 pounds. EKG showed Afib with RVR, 151 bp, diffuse st/t changes, CXR showed no acute process. Labs showed SCr 0.79, K+ 3.9, WBC 4.8, HGB 14.3, PLT 145, troponin negative x 2, TSH 4.161, magnesium 1.7. She was given 3 injections of Cardizem 10 mg with conversion to sinus rhythm while off telemetry between 3:10 AM and 3:14 AM. She was given a one-time dose of PO diltiazem 60 mg. She has been continued on her home Toprol XL 50 mg. She has remained in sinus rhythm with heart rates in the 60s bpm since.   Past Medical History:  Diagnosis Date  . Arthritis    1 finger  . Cough    due to reflux  . Environmental allergies   . GERD (gastroesophageal reflux disease)   . Hypercholesteremia   . Hypertension   . Kidney stones   . Motion sickness    Reading in car  . MVP (mitral valve prolapse)    followed by PCP  . PAF (paroxysmal atrial fibrillation) ([place])    a. diagnosed with Afib 07/02/2017; b. CHADS2VASc => 3 (HTN, age x 1, female); c. On Eliquis    Past Surgical History:  Procedure Laterality Date  . CATARACT EXTRACTION PHACO AND INTRAOCULAR LENS  PLACEMENT (IOC) Left 09/12/2015   Performed by Ronnell Freshwater, MD at [place]  . CATARACT EXTRACTION PHACO AND INTRAOCULAR LENS PLACEMENT (IOC) Right 07/04/2015   Performed by Ronnell Freshwater, MD at [place]  . COLONOSCOPY    . COLONOSCOPY WITH PROPOFOL N/A 12/23/2015   Performed by Manya Silvas, MD at [place]  . DILATION AND CURETTAGE OF UTERUS    . ESOPHAGOGASTRODUODENOSCOPY    . TOE SURGERY     corns removed     Home Meds: Prior to Admission medications   Medication Sig Start Date End Date Taking? Authorizing Provider  atorvastatin (LIPITOR) 10 MG  tablet Take 10 mg every evening by mouth. 03/30/17  Yes [provider]  Calcium Carb-Cholecalciferol (CALCIUM-VITAMIN D) 600-400 MG-UNIT TABS Take 2 tablets daily by mouth.   Yes [provider]  cetirizine (ZYRTEC) 10 MG tablet Take 10 mg daily by mouth.   Yes [provider]  dextromethorphan-guaiFENesin (MUCINEX DM) 30-600 MG 12hr tablet Take 1 tablet 2 (two) times daily by mouth.   Yes [provider]  hydrochlorothiazide (HYDRODIURIL) 12.5 MG tablet Take 12.5 mg daily by mouth.    Yes [provider]  metoprolol succinate (TOPROL-XL) 50 MG 24 hr tablet Take 50 mg by mouth daily. Take with or immediately following a meal.   Yes [provider]  Multiple Vitamin (MULTIVITAMIN) tablet Take 1 tablet by mouth daily. Reported on 09/12/2015   Yes [provider]  naproxen sodium (ALEVE) 220 MG tablet Take 220 mg 2 (two) times daily as needed by mouth (pain).   Yes [provider]  Probiotic Product (PROBIOTIC DAILY PO) Take by mouth.   Yes [provider]  saccharomyces boulardii (FLORASTOR) 250 MG capsule Take 250 mg by mouth 2 (two) times daily.   Yes [provider]    Inpatient Medications: Scheduled Meds: . apixaban  5 mg Oral BID  . atorvastatin  10 mg Oral QPM  . calcium-vitamin D  2 tablet Oral Daily  . docusate sodium  100 mg Oral BID  . hydrochlorothiazide  12.5 mg Oral Daily  . loratadine  10 mg Oral Daily  . metoprolol succinate  50 mg Oral Daily  . multivitamin with minerals  1 tablet Oral Daily  . saccharomyces boulardii  250 mg Oral BID   Continuous Infusions:  PRN Meds: acetaminophen **OR** acetaminophen, ondansetron **OR** ondansetron (ZOFRAN) IV  Allergies:   Allergies  Allergen Reactions  . Ampicillin Rash  . Penicillins Rash    Has patient had a PCN reaction causing immediate rash, facial/tongue/throat swelling, SOB or lightheadedness with hypotension: No Has patient had a PCN  reaction causing severe rash involving mucus membranes or skin necrosis: No Has patient had a PCN reaction that required hospitalization: No Has patient had a PCN reaction occurring within the last 10 years: No If all of the above answers are "NO", then may proceed with Cephalosporin use.   Sarina Ill [Sulfamethoxazole-Trimethoprim] Rash    Social History:   Social History   Socioeconomic History  . Marital status: Married    Spouse name: Not on file  . Number of children: Not on file  . Years of education: Not on file  . Highest education level: Not on file  Social Needs  . Financial resource strain: Not on file  . Food insecurity - worry: Not on file  . Food insecurity - inability: Not on file  . Transportation needs - medical: Not on  file  . Transportation needs - non-medical: Not on file  Occupational History  . Not on file  Tobacco Use  . Smoking status: Never Smoker  . Smokeless tobacco: Never Used  Substance and Sexual Activity  . Alcohol use: Yes    Alcohol/week: 0.6 oz    Types: 1 Glasses of wine per week  . Drug use: No  . Sexual activity: Not on file  Other Topics Concern  . Not on file  Social History Narrative  . Not on file     Family History:  Family History  Problem Relation Age of Onset  . Breast cancer Cousin   . Atrial fibrillation Mother   . CAD Father   . CAD Brother        a. s/p 5 vessel CABG at age 63    ROS:  Review of Systems  Constitutional: Positive for malaise/fatigue. Negative for chills, diaphoresis, fever and weight loss.  HENT: Negative for congestion.   Eyes: Negative for discharge and redness.  Respiratory: Negative for cough, hemoptysis, sputum production, shortness of breath and wheezing.   Cardiovascular: Positive for palpitations. Negative for chest pain, orthopnea, claudication, leg swelling and PND.  Gastrointestinal: Positive for diarrhea and heartburn. Negative for abdominal pain, blood in stool, constipation, melena,  nausea and vomiting.  Genitourinary: Negative for hematuria.  Musculoskeletal: Negative for falls and myalgias.  Skin: Negative for rash.  Neurological: Negative for dizziness, tingling, tremors, sensory change, speech change, focal weakness, loss of consciousness and weakness.  Endo/Heme/Allergies: Does not bruise/bleed easily.  Psychiatric/Behavioral: Negative for substance abuse. The patient is not nervous/anxious.   All other systems reviewed and are negative.     Physical Exam/Data:   Vitals:   07/02/17 0430 07/02/17 0531 07/02/17 0544 07/02/17 0736  BP: 112/77 134/66  120/70  Pulse: (!) 52 65  66  Resp: 13 18    Temp:  98.2 F (36.8 C)  97.8 F (36.6 C)  TempSrc:  Oral  Oral  SpO2: 97% 97%  96%  Weight:  164 lb 14.4 oz (74.8 kg) 164 lb 14.4 oz (74.8 kg)   Height:   5\' 6"  (1.676 m)     Intake/Output Summary (Last 24 hours) at 07/02/2017 0927 Last data filed at 07/02/2017 0531 Gross per 24 hour  Intake -  Output 0 ml  Net 0 ml   Filed Weights   07/02/17 0137 07/02/17 0531 07/02/17 0544  Weight: 168 lb (76.2 kg) 164 lb 14.4 oz (74.8 kg) 164 lb 14.4 oz (74.8 kg)   Body mass index is 26.62 kg/m.   Physical Exam: General: Well developed, well nourished, in no acute distress. Head: Normocephalic, atraumatic, sclera non-icteric, no xanthomas, nares without discharge.  Neck: Negative for carotid bruits. JVD not elevated. Lungs: Clear bilaterally to auscultation without wheezes, rales, or rhonchi. Breathing is unlabored. Heart: RRR with S1 S2. No murmurs, rubs, or gallops appreciated. Abdomen: Soft, non-tender, non-distended with normoactive bowel sounds. No hepatomegaly. No rebound/guarding. No obvious abdominal masses. Msk:  Strength and tone appear normal for age. Extremities: No clubbing or cyanosis. No edema. Distal pedal pulses are 2+ and equal bilaterally. Neuro: Alert and oriented X 3. No facial asymmetry. No focal deficit. Moves all extremities  spontaneously. Psych:  Responds to questions appropriately with a normal affect.   EKG:  The EKG was personally reviewed and demonstrates: Afib with RVR, 151 bpm, diffuse st/t changes  Telemetry:  Telemetry was personally reviewed and demonstrates: Afib with RVR, 140s bpm. Converted to NSR  while off telemetry between 3:10 and 3:14 AM. Has remained in sinus rhythm with heart rates in the 60s bpm since.   Weights: Filed Weights   07/02/17 0137 07/02/17 0531 07/02/17 0544  Weight: 168 lb (76.2 kg) 164 lb 14.4 oz (74.8 kg) 164 lb 14.4 oz (74.8 kg)    Relevant CV Studies: TTE pending  Laboratory Data:  Chemistry Recent Labs  Lab 07/02/17 0143  NA 141  K 3.9  CL 107  CO2 26  GLUCOSE 98  BUN 26*  CREATININE 0.79  CALCIUM 10.0  GFRNONAA >60  GFRAA >60  ANIONGAP 8    No results for input(s): PROT, ALBUMIN, AST, ALT, ALKPHOS, BILITOT in the last 168 hours. Hematology Recent Labs  Lab 07/02/17 0143  WBC 4.8  RBC 4.56  HGB 14.3  HCT 41.8  MCV 91.5  MCH 31.5  MCHC 34.4  RDW 13.0  PLT 145*   Cardiac Enzymes Recent Labs  Lab 07/02/17 0143  TROPONINI <0.03   No results for input(s): TROPIPOC in the last 168 hours.  BNPNo results for input(s): BNP, PROBNP in the last 168 hours.  DDimer No results for input(s): DDIMER in the last 168 hours.  Radiology/Studies:  Dg Chest Port 1 View  Result Date: 07/02/2017 IMPRESSION: No active disease. Electronically Signed   By: Anner Crete M.D.   On: 07/02/2017 02:39    Assessment and Plan:   1. New onset Afib: -Converted to sinus rhythm while off telemetry between 3:10 AM and 3:14 AM and has remained in sinus rhythm with heart rates in the 60s bpm since -Continue Toprol XL 50 mg daily for rate control -If heart rate allows, consider holding HCTZ and titration of Toprol to 75 mg daily -Agree with Eliquis 5 mg bid for long term, full dose anticoagulation given elevated CHADS2VASc of at least 3 (HTN, age x 2, female),  pending echo results -Replete potassium to goal > 4.0 -Replete magnesium to goal > 2.0 -TSH normal -Consider outpatient sleep study -Await echo -Cycle troponin x 3 to rule out -Ambulate this morning to assess heart rate and breathing  2. HTN: -Improved -Continue Toprol XL and HCTZ  3. HLD: -Check FLP and LFT -Lipitor   For questions or updates, please contact [facility] Please consult [email or web address] for contact info under Cardiology/STEMI.   Signed, Christell Faith, PA-C [place] Pager: [phone] 07/02/2017, 9:27 AM

## 2017-07-02 NOTE — ED Notes (Signed)
Pt up to the rest room with steady gait.  

## 2017-07-02 NOTE — Care Management (Signed)
Provided patient with Eliquis 30 day trial coupon.  she has medication coverage with her medicare

## 2017-07-03 ENCOUNTER — Telehealth: Payer: Self-pay | Admitting: Internal Medicine

## 2017-07-03 NOTE — Telephone Encounter (Signed)
Patient contacted regarding discharge from [facility] on 07/02/17.   Patient understands to follow up with provider ? On 07/10/17 at 10:30am at [facility].  Patient understands discharge instructions? Yes   Patient understands medications and regiment? Yes   Patient understands to bring all medications to this visit? Yes

## 2017-07-03 NOTE — Discharge Summary (Signed)
[facility] [place] NAME: Carly Beltran    MR#:  [id number]  DATE OF BIRTH:  Dec 18, 1945  DATE OF ADMISSION:  07/02/2017 ADMITTING PHYSICIAN: Harrie Foreman, MD  DATE OF DISCHARGE: 07/02/2017  6:15 PM  PRIMARY CARE PHYSICIAN: Dion Body, MD   ADMISSION DIAGNOSIS:  Palpitations [R00.2] Atrial fibrillation with rapid ventricular response ([place]) [I48.91]  DISCHARGE DIAGNOSIS:  Active Problems:   Atrial fibrillation with RVR ([place])   SECONDARY DIAGNOSIS:   Past Medical History:  Diagnosis Date  . Arthritis    1 finger  . Cough    due to reflux  . Environmental allergies   . GERD (gastroesophageal reflux disease)   . Hypercholesteremia   . Hypertension   . Kidney stones   . Motion sickness    Reading in car  . MVP (mitral valve prolapse)    followed by PCP  . PAF (paroxysmal atrial fibrillation) ([place])    a. diagnosed with Afib 07/02/2017; b. CHADS2VASc => 3 (HTN, age x 1, female); c. On Eliquis     ADMITTING HISTORY  Chief Complaint: Palpitations HPI: Patient with past medical history of hypertension, mitral valve prolapse and hyperlipidemia presents to the emergency department complaining of palpitations.  She states that she was awoken from sleep with the sensation of her heart racing.  She checked her pulse which she found to be very high.  She denies lightheadedness, shortness of breath, nausea, vomiting or diaphoresis.  In the emergency department she was found have atrial fibrillation with rapid ventricular rate.  She was started on a diltiazem drip which eventually converted the patient to normal sinus rhythm.  She was admitted to the hospitalist service for further evaluation.   HOSPITAL COURSE:   *New-onset atrial fibrillation. Patient was admitted to telemetry floor.  Started on Cardizem drip.  Converted to normal sinus rhythm but heart rate stable in the 60s. Echocardiogram showed no valvular  abnormalities and showed normal ejection fraction.  Troponin remained stable.  No further atrial fibrillation episodes on telemetry. Toprol increased from 50 mg to 75 mg.  Seen by cardiology.  Started with Eliquis.  With patient being in normal sinus rhythm, normal troponin and normal echocardiogram she is being discharged home on Toprol 75 mg daily.  And Eliquis.  Prescription sent to pharmacy.    CONSULTS OBTAINED:  Treatment Team:  Nelva Bush, MD  DRUG ALLERGIES:   Allergies  Allergen Reactions  . Ampicillin Rash  . Penicillins Rash    Has patient had a PCN reaction causing immediate rash, facial/tongue/throat swelling, SOB or lightheadedness with hypotension: No Has patient had a PCN reaction causing severe rash involving mucus membranes or skin necrosis: No Has patient had a PCN reaction that required hospitalization: No Has patient had a PCN reaction occurring within the last 10 years: No If all of the above answers are "NO", then may proceed with Cephalosporin use.   Sarina Ill [Sulfamethoxazole-Trimethoprim] Rash    DISCHARGE MEDICATIONS:   Discharge Medication List as of 07/02/2017  4:35 PM    START taking these medications   Details  apixaban (ELIQUIS) 5 MG TABS tablet Take 1 tablet (5 mg total) by mouth 2 (two) times daily., Starting Tue 07/02/2017, Normal    Magnesium Oxide 400 (240 Mg) MG TABS Take 1 tablet (400 mg total) by mouth daily., Starting Tue 07/02/2017, Normal    potassium chloride (K-DUR) 10 MEQ tablet Take 1 tablet (10 mEq total) by mouth daily., Starting Tue  07/02/2017, Normal      CONTINUE these medications which have CHANGED   Details  metoprolol succinate (TOPROL-XL) 25 MG 24 hr tablet Take 3 tablets (75 mg total) by mouth daily. Take with or immediately following a meal., Starting Tue 07/02/2017, Normal      CONTINUE these medications which have NOT CHANGED   Details  atorvastatin (LIPITOR) 10 MG tablet Take 10 mg every evening by mouth.,  Starting Sat 03/30/2017, Historical Med    Calcium Carb-Cholecalciferol (CALCIUM-VITAMIN D) 600-400 MG-UNIT TABS Take 2 tablets daily by mouth., Historical Med    cetirizine (ZYRTEC) 10 MG tablet Take 10 mg daily by mouth., Historical Med    dextromethorphan-guaiFENesin (MUCINEX DM) 30-600 MG 12hr tablet Take 1 tablet 2 (two) times daily by mouth., Historical Med    hydrochlorothiazide (HYDRODIURIL) 12.5 MG tablet Take 12.5 mg daily by mouth. , Historical Med    Multiple Vitamin (MULTIVITAMIN) tablet Take 1 tablet by mouth daily. Reported on 09/12/2015, Until Discontinued, Historical Med    naproxen sodium (ALEVE) 220 MG tablet Take 220 mg 2 (two) times daily as needed by mouth (pain)., Historical Med    Probiotic Product (PROBIOTIC DAILY PO) Take by mouth., Historical Med    saccharomyces boulardii (FLORASTOR) 250 MG capsule Take 250 mg by mouth 2 (two) times daily., Historical Med        Today   VITAL SIGNS:  Blood pressure 133/69, pulse 63, temperature 98.3 F (36.8 C), temperature source Oral, resp. rate 18, height 5\' 6"  (1.676 m), weight 74.8 kg (164 lb 14.4 oz), SpO2 97 %.  I/O:  No intake or output data in the 24 hours ending 07/03/17 1300  PHYSICAL EXAMINATION:  Physical Exam  GENERAL:  71 y.o.-year-old patient lying in the bed with no acute distress.  LUNGS: Normal breath sounds bilaterally, no wheezing, rales,rhonchi or crepitation. No use of accessory muscles of respiration.  CARDIOVASCULAR: S1, S2 normal. No murmurs, rubs, or gallops.  ABDOMEN: Soft, non-tender, non-distended. Bowel sounds present. No organomegaly or mass.  NEUROLOGIC: Moves all 4 extremities. PSYCHIATRIC: The patient is alert and oriented x 3.  SKIN: No obvious rash, lesion, or ulcer.   DATA REVIEW:   CBC Recent Labs  Lab 07/02/17 0143  WBC 4.8  HGB 14.3  HCT 41.8  PLT 145*    Chemistries  Recent Labs  Lab 07/02/17 0143 07/02/17 0148 07/02/17 0854  NA 141  --   --   K 3.9  --    --   CL 107  --   --   CO2 26  --   --   GLUCOSE 98  --   --   BUN 26*  --   --   CREATININE 0.79  --   --   CALCIUM 10.0  --   --   MG  --   --  1.7  AST  --  29  --   ALT  --  16  --   ALKPHOS  --  85  --   BILITOT  --  1.1  --     Cardiac Enzymes Recent Labs  Lab 07/02/17 1434  TROPONINI <0.03    Microbiology Results  No results found for this or any previous visit.  RADIOLOGY:  Dg Chest Port 1 View  Result Date: 07/02/2017 CLINICAL DATA:  22-year-old female with palpitation. EXAM: PORTABLE CHEST 1 VIEW COMPARISON:  Chest CT dated 08/30/2008 FINDINGS: The heart size and mediastinal contours are within normal limits. Both lungs are  clear. The visualized skeletal structures are unremarkable. IMPRESSION: No active disease. Electronically Signed   By: Anner Crete M.D.   On: 07/02/2017 02:39    Follow up with PCP in 1 week.  Management plans discussed with the patient, family and they are in agreement.  CODE STATUS:  Code Status History    Date Active Date Inactive Code Status Order ID Comments User Context   07/02/2017 05:03 07/02/2017 21:20 Full Code [id number]  Harrie Foreman, MD ED    Advance Directive Documentation     Most Recent Value  Type of Advance Directive  Healthcare Power of Attorney, Living will  Pre-existing out of facility DNR order (yellow form or pink MOST form)  No data  "MOST" Form in Place?  No data      TOTAL TIME TAKING CARE OF THIS PATIENT ON DAY OF DISCHARGE: more than 30 minutes.   Leia Alf Anas Reister M.D on 07/03/2017 at 1:00 PM  Between 7am to 6pm - Pager - [phone]  After 6pm go to [email or web address] - password [email or web address] [facility]  Office  [phone]  CC: Primary care physician; Dion Body, MD  Note: This dictation was prepared with Dragon dictation along with smaller phrase technology. Any transcriptional errors that result from this process are unintentional.

## 2017-07-03 NOTE — Telephone Encounter (Signed)
TCM [facility] for new atrial fibrillation with rapid ventricular response  New to Dr. Saunders Revel   Needs 1 wk fu scheduled 11/28  10:30 am with Sharolyn Douglas

## 2017-07-10 ENCOUNTER — Encounter: Payer: Self-pay | Admitting: Nurse Practitioner

## 2017-07-10 ENCOUNTER — Ambulatory Visit: Payer: Medicare HMO | Admitting: Nurse Practitioner

## 2017-07-10 VITALS — BP 152/80 | HR 61 | Ht 66.0 in | Wt 164.2 lb

## 2017-07-10 DIAGNOSIS — I10 Essential (primary) hypertension: Secondary | ICD-10-CM

## 2017-07-10 DIAGNOSIS — R9431 Abnormal electrocardiogram [ECG] [EKG]: Secondary | ICD-10-CM | POA: Diagnosis not present

## 2017-07-10 DIAGNOSIS — I480 Paroxysmal atrial fibrillation: Secondary | ICD-10-CM | POA: Diagnosis not present

## 2017-07-10 NOTE — Patient Instructions (Addendum)
Medication Instructions:  Your physician recommends that you continue on your current medications as directed. Please refer to the Current Medication list given to you today.   Labwork: CBC and BMET in one month at the [facility]. No appt needed.  Testing/Procedures: Your physician has requested that you have a lexiscan myoview. For further information please visit [email or web address]. Please follow instruction sheet, as given.  [place]  Your caregiver has ordered a Stress Test with nuclear imaging. The purpose of this test is to evaluate the blood supply to your heart muscle. This procedure is referred to as a "Non-Invasive Stress Test." This is because other than having an IV started in your vein, nothing is inserted or "invades" your body. Cardiac stress tests are done to find areas of poor blood flow to the heart by determining the extent of coronary artery disease (CAD). Some patients exercise on a treadmill, which naturally increases the blood flow to your heart, while others who are  unable to walk on a treadmill due to physical limitations have a pharmacologic/chemical stress agent called Lexiscan . This medicine will mimic walking on a treadmill by temporarily increasing your coronary blood flow.   Please note: these test may take anywhere between 2-4 hours to complete  PLEASE REPORT TO [facility] WILL DIRECT YOU WHERE TO GO  Date of Procedure: Friday, December 7  Arrival Time for Procedure:___7:15am__  Instructions regarding medication:     __xx__:  Hold metoprolol the morning of procedure  _xx___:  Hold other medications as follows: HCTZ the morning of.  You may resume both medications same day after testing.   PLEASE NOTIFY THE OFFICE AT LEAST 46 HOURS IN ADVANCE IF YOU ARE UNABLE TO KEEP YOUR APPOINTMENT.  [phone] AND  PLEASE NOTIFY NUCLEAR MEDICINE AT [facility] AT LEAST 24 HOURS IN ADVANCE IF YOU ARE UNABLE TO  KEEP YOUR APPOINTMENT. [phone]  How to prepare for your Myoview test:  1. Do not eat or drink after midnight 2. No caffeine for 24 hours prior to test 3. No smoking 24 hours prior to test. 4. Your medication may be taken with water.  If your doctor stopped a medication because of this test, do not take that medication. 5. Ladies, please do not wear dresses.  Skirts or pants are appropriate. Please wear a short sleeve shirt. 6. No perfume, cologne or lotion. 7. Wear comfortable walking shoes. No heels!            Follow-Up: Your physician recommends that you schedule a follow-up appointment in: 2-3 months with Dr. Saunders Revel.    Any Other Special Instructions Will Be Listed Below (If Applicable).     If you need a refill on your cardiac medications before your next appointment, please call your pharmacy.  Cardiac Nuclear Scan A cardiac nuclear scan is a test that measures blood flow to the heart when a person is resting and when he or she is exercising. The test looks for problems such as:  Not enough blood reaching a portion of the heart.  The heart muscle not working normally.  You may need this test if:  You have heart disease.  You have had abnormal lab results.  You have had heart surgery or angioplasty.  You have chest pain.  You have shortness of breath.  In this test, a radioactive dye (tracer) is injected into your bloodstream. After the tracer has traveled to your heart, an imaging device is  used to measure how much of the tracer is absorbed by or distributed to various areas of your heart. This procedure is usually done at a hospital and takes 2-4 hours. Tell a health care provider about:  Any allergies you have.  All medicines you are taking, including vitamins, herbs, eye drops, creams, and over-the-counter medicines.  Any problems you or family members have had with the use of anesthetic medicines.  Any blood disorders you have.  Any surgeries  you have had.  Any medical conditions you have.  Whether you are pregnant or may be pregnant. What are the risks? Generally, this is a safe procedure. However, problems may occur, including:  Serious chest pain and heart attack. This is only a risk if the stress portion of the test is done.  Rapid heartbeat.  Sensation of warmth in your chest. This usually passes quickly.  What happens before the procedure?  Ask your health care provider about changing or stopping your regular medicines. This is especially important if you are taking diabetes medicines or blood thinners.  Remove your jewelry on the day of the procedure. What happens during the procedure?  An IV tube will be inserted into one of your veins.  Your health care provider will inject a small amount of radioactive tracer through the tube.  You will wait for 20-40 minutes while the tracer travels through your bloodstream.  Your heart activity will be monitored with an electrocardiogram (ECG).  You will lie down on an exam table.  Images of your heart will be taken for about 15-20 minutes.  You may be asked to exercise on a treadmill or stationary bike. While you exercise, your heart's activity will be monitored with an ECG, and your blood pressure will be checked. If you are unable to exercise, you may be given a medicine to increase blood flow to parts of your heart.  When blood flow to your heart has peaked, a tracer will again be injected through the IV tube.  After 20-40 minutes, you will get back on the exam table and have more images taken of your heart.  When the procedure is over, your IV tube will be removed. The procedure may vary among health care providers and hospitals. Depending on the type of tracer used, scans may need to be repeated 3-4 hours later. What happens after the procedure?  Unless your health care provider tells you otherwise, you may return to your normal schedule, including diet,  activities, and medicines.  Unless your health care provider tells you otherwise, you may increase your fluid intake. This will help flush the contrast dye from your body. Drink enough fluid to keep your urine clear or pale yellow.  It is up to you to get your test results. Ask your health care provider, or the department that is doing the test, when your results will be ready. Summary  A cardiac nuclear scan measures the blood flow to the heart when a person is resting and when he or she is exercising.  You may need this test if you are at risk for heart disease.  Tell your health care provider if you are pregnant.  Unless your health care provider tells you otherwise, increase your fluid intake. This will help flush the contrast dye from your body. Drink enough fluid to keep your urine clear or pale yellow. This information is not intended to replace advice given to you by your health care provider. Make sure you discuss any questions you  have with your health care provider. Document Released: 08/24/2004 Document Revised: 08/01/2016 Document Reviewed: 07/08/2013 Elsevier Interactive Patient Education  2017 [employer].

## 2017-07-10 NOTE — Progress Notes (Signed)
Office Visit    Patient Name: Carly Beltran Date of Encounter: 07/10/2017  Primary Care Provider:  Dion Body, MD Primary Cardiologist:  Andree Coss, MD   Chief Complaint    71 y/o ? with a history of hypertension, GERD, hyperlipidemia, and chronic cough, who was recently admitted for A. fib with RVR, and presents for follow-up.  Past Medical History    Past Medical History:  Diagnosis Date  . Arthritis    1 finger  . Cough    due to reflux  . Environmental allergies   . GERD (gastroesophageal reflux disease)   . Hypercholesteremia   . Hypertension   . Kidney stones   . Motion sickness    Reading in car  . MVP (mitral valve prolapse)    a. 06/2017 not noted on echo.  Marland Kitchen PAF (paroxysmal atrial fibrillation) ([place])    a. Diagnosed with Afib 07/02/2017; b. CHADS2VASc -> 3 (HTN, age x 1, female) -> Eliquis; c. 06/2017 Echo: EF 65-70%, no rwma, triv AI. Nl RV fxn.   Past Surgical History:  Procedure Laterality Date  . CATARACT EXTRACTION W/PHACO Right 07/04/2015   Procedure: CATARACT EXTRACTION PHACO AND INTRAOCULAR LENS PLACEMENT ([place]);  Surgeon: Ronnell Freshwater, MD;  Location: [facility];  Service: Ophthalmology;  Laterality: Right;  . CATARACT EXTRACTION W/PHACO Left 09/12/2015   Procedure: CATARACT EXTRACTION PHACO AND INTRAOCULAR LENS PLACEMENT ([facility]);  Surgeon: Ronnell Freshwater, MD;  Location: [facility];  Service: Ophthalmology;  Laterality: Left;  . COLONOSCOPY    . COLONOSCOPY WITH PROPOFOL N/A 12/23/2015   Procedure: COLONOSCOPY WITH PROPOFOL;  Surgeon: Manya Silvas, MD;  Location: [facility] ENDOSCOPY;  Service: Endoscopy;  Laterality: N/A;  . DILATION AND CURETTAGE OF UTERUS    . ESOPHAGOGASTRODUODENOSCOPY    . TOE SURGERY     corns removed    Allergies  Allergies  Allergen Reactions  . Ampicillin Rash  . Penicillins Rash    Has patient had a PCN reaction causing immediate rash, facial/tongue/throat swelling, SOB or  lightheadedness with hypotension: No Has patient had a PCN reaction causing severe rash involving mucus membranes or skin necrosis: No Has patient had a PCN reaction that required hospitalization: No Has patient had a PCN reaction occurring within the last 10 years: No If all of the above answers are "NO", then may proceed with Cephalosporin use.   Sarina Ill [Sulfamethoxazole-Trimethoprim] Rash    History of Present Illness    71 year old female with the above past medical history including hypertension, hyperlipidemia, nephrolithiasis, chronic cough, and reported history of mitral valve prolapse.  She was recently admitted to [facility] with persistent palpitations and found to be in rapid atrial fibrillation.  She was placed on IV diltiazem and converted to sinus rhythm.  She had been on chronic metoprolol therapy and this was titrated to 75 mg daily.  Echocardiogram showed normal LV function.  Of note, while in atrial fibrillation, she was noted to have ST segment changes and recommendation was made for outpatient stress testing.  This has yet to occur.  Since her discharge, she has noted occasional, mild, brief palpitations, usually occurring when she wakes up in the morning, lasting just a few seconds, and resolving spontaneously.  She has not had any persistent palpitations that she would equate to atrial fibrillation.  She denies chest pain, dyspnea, PND, orthopnea, dizziness, syncope, edema, or early satiety.  Home Medications    Prior to Admission medications   Medication Sig Start  Date End Date Taking? Authorizing Provider  apixaban (ELIQUIS) 5 MG TABS tablet Take 1 tablet (5 mg total) by mouth 2 (two) times daily. 07/02/17  Yes Sudini, Alveta Heimlich, MD  atorvastatin (LIPITOR) 10 MG tablet Take 10 mg every evening by mouth. 03/30/17  Yes [provider]  hydrochlorothiazide (HYDRODIURIL) 12.5 MG tablet Take 12.5 mg daily by mouth.    Yes [provider]  Magnesium  Oxide 400 (240 Mg) MG TABS Take 1 tablet (400 mg total) by mouth daily. 07/02/17  Yes Sudini, Alveta Heimlich, MD  metoprolol succinate (TOPROL-XL) 25 MG 24 hr tablet Take 3 tablets (75 mg total) by mouth daily. Take with or immediately following a meal. 07/02/17  Yes Sudini, Alveta Heimlich, MD  Multiple Vitamin (MULTIVITAMIN) tablet Take 1 tablet by mouth daily. Reported on 09/12/2015   Yes [provider]  potassium chloride (K-DUR) 10 MEQ tablet Take 1 tablet (10 mEq total) by mouth daily. 07/02/17  Yes Hillary Bow, MD    Review of Systems    Occasional/brief morning palpitations.  She denies chest pain, dyspnea, PND, orthopnea, dizziness, syncope, edema, or early satiety.  All other systems reviewed and are otherwise negative except as noted above.  Physical Exam    VS:  BP (!) 152/80 (BP Location: Left Arm, Patient Position: Sitting, Cuff Size: Normal)   Pulse 61   Ht 5\' 6"  (1.676 m)   Wt 164 lb 4 oz (74.5 kg)   BMI 26.51 kg/m  , BMI Body mass index is 26.51 kg/m. GEN: Well nourished, well developed, in no acute distress.  HEENT: normal.  Neck: Supple, no JVD, carotid bruits, or masses. Cardiac: RRR, no murmurs, rubs, or gallops. No clubbing, cyanosis, edema.  Radials/DP/PT 2+ and equal bilaterally.  Respiratory:  Respirations regular and unlabored, clear to auscultation bilaterally. GI: Soft, nontender, nondistended, BS + x 4. MS: no deformity or atrophy. Skin: warm and dry, no rash. Neuro:  Strength and sensation are intact. Psych: Normal affect.  Accessory Clinical Findings    ECG -regular sinus rhythm, 61, left atrial enlargement, no acute ST or T changes.  Assessment & Plan    1.  Paroxysmal atrial fibrillation: Patient recently admitted with palpitations and rapid atrial fibrillation.  She converted on IV diltiazem and has since been on oral beta-blocker therapy at 75 mg daily.  She is tolerating this well and has not had any recurrent persistent A. fib though she has noted  occasional palpitations.  She is anticoagulated with Eliquis and tolerating this well.  We will plan to follow-up a CBC and basic metabolic panel in approximately 1 month.  As she had ST segment changes when in atrial fibrillation during hospital station, I will arrange for an exercise Myoview as previously planned.  2.  Abnormal ECG: As above, ST segment changes while in atrial fibrillation during hospitalization.  No chest pain or dyspnea.  Plan follow-up exercise Myoview as previously planned.  3.  Essential hypertension: Blood pressure elevated today.  She says this typically runs in the 130s at home.  I will not make any changes today and have recommended that she follow her blood pressures at home and contact us if she is consistently greater than 130-140.  4.  Disposition: Follow-up stress testing as above.  Follow-up CBC and basic metabolic panel in 1 month.  Follow-up in clinic in 3 months or sooner if necessary.   Murray Hodgkins, NP 07/10/2017, 1:22 PM

## 2017-07-12 ENCOUNTER — Other Ambulatory Visit: Payer: Medicare HMO

## 2017-07-18 ENCOUNTER — Ambulatory Visit
Admission: RE | Admit: 2017-07-18 | Discharge: 2017-07-18 | Disposition: A | Discharge status: Home / Self Care | Payer: Medicare HMO | Source: Ambulatory Visit | Attending: Family Medicine | Admitting: Family Medicine

## 2017-07-18 DIAGNOSIS — Z1231 Encounter for screening mammogram for malignant neoplasm of breast: Secondary | ICD-10-CM | POA: Diagnosis not present

## 2017-07-18 DIAGNOSIS — R9431 Abnormal electrocardiogram [ECG] [EKG]: Secondary | ICD-10-CM | POA: Diagnosis present

## 2017-07-19 ENCOUNTER — Ambulatory Visit (HOSPITAL_BASED_OUTPATIENT_CLINIC_OR_DEPARTMENT_OTHER)
Admission: RE | Admit: 2017-07-19 | Discharge: 2017-07-19 | Disposition: A | Discharge status: Home / Self Care | Payer: Medicare HMO | Source: Ambulatory Visit | Attending: Nurse Practitioner | Admitting: Nurse Practitioner

## 2017-07-19 DIAGNOSIS — R9431 Abnormal electrocardiogram [ECG] [EKG]: Secondary | ICD-10-CM

## 2017-07-19 DIAGNOSIS — Z1231 Encounter for screening mammogram for malignant neoplasm of breast: Secondary | ICD-10-CM | POA: Diagnosis not present

## 2017-07-19 LAB — NM MYOCAR MULTI W/SPECT W/WALL MOTION / EF
CHL CUP NUCLEAR SSS: 1
CHL CUP RESTING HR STRESS: 64 {beats}/min
CSEPEDS: 10 s
CSEPEW: 4.9 METS
CSEPHR: 90 %
CSEPPHR: 136 {beats}/min
Exercise duration (min): 4 min
LV dias vol: 48 mL (ref 46–106)
LVSYSVOL: 15 mL
MPHR: 150 {beats}/min
SDS: 1
SRS: 0
TID: 1.03

## 2017-07-19 MED ORDER — TECHNETIUM TC 99M TETROFOSMIN IV KIT
10.0000 | PACK | Freq: Once | INTRAVENOUS | Status: AC | PRN
  Administered 2017-07-19: 12.94 via INTRAVENOUS

## 2017-07-19 MED ORDER — TECHNETIUM TC 99M TETROFOSMIN IV KIT
30.9420 | PACK | Freq: Once | INTRAVENOUS | Status: AC | PRN
  Administered 2017-07-19: 30.942 via INTRAVENOUS

## 2017-07-26 ENCOUNTER — Telehealth: Payer: Self-pay | Admitting: Internal Medicine

## 2017-07-26 ENCOUNTER — Other Ambulatory Visit: Payer: Self-pay

## 2017-07-26 MED ORDER — METOPROLOL SUCCINATE ER 25 MG PO TB24: 75.0000 mg | ORAL_TABLET | Freq: Every day | ORAL | 0 refills | Status: DC

## 2017-07-26 MED ORDER — POTASSIUM CHLORIDE ER 10 MEQ PO TBCR: 10.0000 meq | EXTENDED_RELEASE_TABLET | Freq: Every day | ORAL | 0 refills | Status: DC

## 2017-07-26 NOTE — Telephone Encounter (Signed)
Requested Prescriptions   Signed Prescriptions Disp Refills  . metoprolol succinate (TOPROL-XL) 25 MG 24 hr tablet 270 tablet 0    Sig: Take 3 tablets (75 mg total) by mouth daily. Take with or immediately following a meal.    Authorizing Provider: Murray Hodgkins R    Ordering User: Janan Ridge  . potassium chloride (K-DUR) 10 MEQ tablet 90 tablet 0    Sig: Take 1 tablet (10 mEq total) by mouth daily.    Authorizing Provider: Rogelia Mire    Ordering User: Janan Ridge

## 2017-07-26 NOTE — Telephone Encounter (Signed)
°*  STAT* If patient is at the pharmacy, call can be transferred to refill team.   1. Which medications need to be refilled? (please list name of each medication and dose if known)  Potassium Metoprolol eliquis  2. Which pharmacy/location (including street and city if local pharmacy) is medication to be sent to? [facility] on [address]   3. Do they need a 30 day or 90 day supply?  90 day

## 2017-07-26 NOTE — Telephone Encounter (Signed)
Please advise about Eliquis refill, Thanks! I have already send in refills for the Metoprolol and potassium  Requested Prescriptions   Signed Prescriptions Disp Refills  . metoprolol succinate (TOPROL-XL) 25 MG 24 hr tablet 270 tablet 0    Sig: Take 3 tablets (75 mg total) by mouth daily. Take with or immediately following a meal.    Authorizing Provider: Murray Hodgkins R    Ordering User: Janan Ridge  . potassium chloride (K-DUR) 10 MEQ tablet 90 tablet 0    Sig: Take 1 tablet (10 mEq total) by mouth daily.    Authorizing Provider: Rogelia Mire    Ordering User: Janan Ridge

## 2017-07-29 MED ORDER — APIXABAN 5 MG PO TABS: 5.0000 mg | ORAL_TABLET | Freq: Two times a day (BID) | ORAL | 6 refills | Status: DC

## 2017-07-29 NOTE — Telephone Encounter (Signed)
Eliquis refilled.  

## 2017-07-29 NOTE — Addendum Note (Signed)
Addended by: Dede Query R on: 07/29/2017 08:14 AM   Modules accepted: Orders

## 2017-08-09 ENCOUNTER — Other Ambulatory Visit
Admission: RE | Admit: 2017-08-09 | Discharge: 2017-08-09 | Disposition: A | Discharge status: Home / Self Care | Payer: Medicare HMO | Source: Ambulatory Visit | Attending: Nurse Practitioner | Admitting: Nurse Practitioner

## 2017-08-09 DIAGNOSIS — I480 Paroxysmal atrial fibrillation: Secondary | ICD-10-CM | POA: Diagnosis present

## 2017-08-09 LAB — CBC
HEMATOCRIT: 40.6 % (ref 35.0–47.0)
Hemoglobin: 13.3 g/dL (ref 12.0–16.0)
MCH: 30.4 pg (ref 26.0–34.0)
MCHC: 32.8 g/dL (ref 32.0–36.0)
MCV: 92.5 fL (ref 80.0–100.0)
PLATELETS: 130 10*3/uL — AB (ref 150–440)
RBC: 4.39 MIL/uL (ref 3.80–5.20)
RDW: 13.3 % (ref 11.5–14.5)
WBC: 3.7 10*3/uL (ref 3.6–11.0)

## 2017-08-09 LAB — BASIC METABOLIC PANEL
Anion gap: 5 (ref 5–15)
BUN: 19 mg/dL (ref 6–20)
CO2: 29 mmol/L (ref 22–32)
CREATININE: 0.74 mg/dL (ref 0.44–1.00)
Calcium: 9.9 mg/dL (ref 8.9–10.3)
Chloride: 105 mmol/L (ref 101–111)
Glucose, Bld: 96 mg/dL (ref 65–99)
POTASSIUM: 3.9 mmol/L (ref 3.5–5.1)
SODIUM: 139 mmol/L (ref 135–145)

## 2017-09-24 NOTE — Progress Notes (Signed)
Follow-up Outpatient Visit Date: 09/25/2017  Primary Care Provider: Dion Body, MD [facility] [place] [address]  Chief Complaint: Follow-up atrial fibrillation  HPI:  Carly Beltran is a 72 y.o. year-old female with history of paroxysmal atrial fibrillation, hypertension, hyperlipidemia, and kidney stones, who presents for follow-up of atrial fibrillation.  She was hospitalized on 07/02/17 with atrial fibrillation with rapid ventricular response.  She spontaneously converted back to sinus rhythm after initiation of IV diltiazem.  She subsequently followed up in our office with Ignacia Bayley, NP, on 11 28/18.  Due to mild jaw discomfort while in atrial fibrillation with rapid ventricular response and accompanying ST segment changes, she was subsequently referred for a myocardial perfusion stress test.  No significant myocardial perfusion abnormalities were identified, though inferolateral ST depressions were seen after regadenoson administration.  LVEF was hyperdynamic at 79%.  Today, Carly Beltran reports feeling well.  She still notes occasional palpitations that usually last only a few seconds.  She has not had any sustained racing of her heart like she has noted in the past.  She endorses mild fatigue when metoprolol was increased from 50 to 75 mg daily.  This has since resolved.  She remains on apixaban without bleeding.  She denies chest pain, shortness of breath, jaw discomfort, lightheadedness, edema, and orthopnea.  --------------------------------------------------------------------------------------------------  Cardiovascular History & Procedures: Cardiovascular Problems:  Paroxysmal atrial fibrillation  Risk Factors:  Hypertension, hyperlipidemia, and age greater than 36  Cath/PCI:  None  CV Surgery:  None  EP Procedures and Devices:  None  Non-Invasive Evaluation(s):  Pharmacologic myocardial perfusion stress test (07/19/17): No  evidence of ischemia or scar.  Hyperdynamic left ventricular contraction.  1 mm inferolateral ST depressions with regadenoson.  Transthoracic echocardiogram (07/02/17): Normal LV size and wall thickness.  LVEF 65-70% with normal wall motion.  Normal diastolic function.  Trivial aortic regurgitation.  Normal RV size and function.  Recent CV Pertinent Labs: Lab Results  Component Value Date   CHOL 193 07/02/2017   HDL 91 07/02/2017   LDLCALC 78 07/02/2017   TRIG 120 07/02/2017   CHOLHDL 2.1 07/02/2017   INR 0.98 07/02/2017   K 3.9 08/09/2017   MG 1.7 07/02/2017   BUN 19 08/09/2017   CREATININE 0.74 08/09/2017    Past medical and surgical history were reviewed and updated in EPIC.  Current Meds  Medication Sig  . apixaban (ELIQUIS) 5 MG TABS tablet Take 1 tablet (5 mg total) by mouth 2 (two) times daily.  Marland Kitchen atorvastatin (LIPITOR) 10 MG tablet Take 10 mg every evening by mouth.  . hydrochlorothiazide (HYDRODIURIL) 12.5 MG tablet Take 12.5 mg daily by mouth.   . Magnesium Oxide 400 (240 Mg) MG TABS Take 1 tablet (400 mg total) by mouth daily.  . metoprolol succinate (TOPROL-XL) 25 MG 24 hr tablet Take 3 tablets (75 mg total) by mouth daily. Take with or immediately following a meal.  . Multiple Vitamin (MULTIVITAMIN) tablet Take 1 tablet by mouth daily. Reported on 09/12/2015  . potassium chloride (K-DUR) 10 MEQ tablet Take 1 tablet (10 mEq total) by mouth daily.  . Probiotic Product (PROBIOTIC PO) Take by mouth daily.  . Saccharomyces boulardii (FLORASTOR PO) Take by mouth 3 (three) times daily.    Allergies: Ampicillin; Penicillins; and Septra [sulfamethoxazole-trimethoprim]  Social History   Socioeconomic History  . Marital status: Married    Spouse name: Not on file  . Number of children: Not on file  . Years of  education: Not on file  . Highest education level: Not on file  Social Needs  . Financial resource strain: Not on file  . Food insecurity - worry: Not on file  .  Food insecurity - inability: Not on file  . Transportation needs - medical: Not on file  . Transportation needs - non-medical: Not on file  Occupational History  . Not on file  Tobacco Use  . Smoking status: Never Smoker  . Smokeless tobacco: Never Used  Substance and Sexual Activity  . Alcohol use: Yes    Alcohol/week: 0.6 oz    Types: 1 Glasses of wine per week  . Drug use: No  . Sexual activity: Not on file  Other Topics Concern  . Not on file  Social History Narrative  . Not on file    Family History  Problem Relation Age of Onset  . Breast cancer Cousin   . Atrial fibrillation Mother   . CAD Father   . CAD Brother        a. s/p 5 vessel CABG at age 9    Review of Systems: A 12-system review of systems was performed and was negative except as noted in the HPI.  --------------------------------------------------------------------------------------------------  Physical Exam: BP (!) 142/68 (BP Location: Left Arm, Patient Position: Sitting, Cuff Size: Normal)   Pulse 71   Ht 5\' 6"  (1.676 m)   Wt 167 lb (75.8 kg)   BMI 26.95 kg/m   General: Well-developed, well-nourished woman, seated comfortably in the exam room. HEENT: No conjunctival pallor or scleral icterus. Moist mucous membranes.  OP clear. Neck: Supple without lymphadenopathy, thyromegaly, JVD, or HJR.. Lungs: Normal work of breathing. Clear to auscultation bilaterally without wheezes or crackles. Heart: Regular rate and rhythm without murmurs, rubs, or gallops. Non-displaced PMI. Abd: Bowel sounds present. Soft, NT/ND without hepatosplenomegaly Ext: No lower extremity edema. Skin: Warm and dry without rash.  EKG: Normal sinus rhythm with nonspecific ST changes and possible left atrial enlargement.  Lab Results  Component Value Date   WBC 3.7 08/09/2017   HGB 13.3 08/09/2017   HCT 40.6 08/09/2017   MCV 92.5 08/09/2017   PLT 130 (L) 08/09/2017    Lab Results  Component Value Date   NA 139  08/09/2017   K 3.9 08/09/2017   CL 105 08/09/2017   CO2 29 08/09/2017   BUN 19 08/09/2017   CREATININE 0.74 08/09/2017   GLUCOSE 96 08/09/2017   ALT 16 07/02/2017    Lab Results  Component Value Date   CHOL 193 07/02/2017   HDL 91 07/02/2017   LDLCALC 78 07/02/2017   TRIG 120 07/02/2017   CHOLHDL 2.1 07/02/2017    --------------------------------------------------------------------------------------------------  ASSESSMENT AND PLAN: Paroxysmal atrial fibrillation Carly Beltran still reports occasional brief palpitations but has not had any sustained racing of the heart.  She is tolerating metoprolol and apixaban well.  No medication changes at this time.  Abnormal EKG Subtle nonspecific ST changes are again noted on EKG today, similar to prior tracings.  Myocardial perfusion stress test showed no evidence of ischemia or scar, though accentuation of baseline EKG changes was noted.  These findings are nonspecific.  Given that she has not experienced any angina or shortness of breath, I have a low suspicion for obstructive CAD.  We will forego additional ischemia testing at this time.  Continue with primary prevention.  Hypertension Blood pressure mildly elevated today, though home readings are within the normal range.  No medication changes today.  Follow-up:  Return to clinic in 6 months.  Nelva Bush, MD 09/25/2017 8:57 PM

## 2017-09-25 ENCOUNTER — Encounter: Payer: Self-pay | Admitting: Internal Medicine

## 2017-09-25 ENCOUNTER — Ambulatory Visit: Payer: Medicare HMO | Admitting: Internal Medicine

## 2017-09-25 VITALS — BP 142/68 | HR 71 | Ht 66.0 in | Wt 167.0 lb

## 2017-09-25 DIAGNOSIS — I10 Essential (primary) hypertension: Secondary | ICD-10-CM | POA: Diagnosis not present

## 2017-09-25 DIAGNOSIS — R9431 Abnormal electrocardiogram [ECG] [EKG]: Secondary | ICD-10-CM | POA: Diagnosis not present

## 2017-09-25 DIAGNOSIS — I480 Paroxysmal atrial fibrillation: Secondary | ICD-10-CM

## 2017-09-25 NOTE — Patient Instructions (Signed)

## 2017-10-23 ENCOUNTER — Other Ambulatory Visit: Payer: Self-pay | Admitting: Nurse Practitioner

## 2017-11-11 ENCOUNTER — Other Ambulatory Visit: Payer: Self-pay | Admitting: Nurse Practitioner

## 2017-11-11 DEATH — deceased

## 2018-01-23 ENCOUNTER — Telehealth: Payer: Self-pay | Admitting: Internal Medicine

## 2018-01-23 NOTE — Telephone Encounter (Signed)
Please review for refill, Thanks !  

## 2018-01-23 NOTE — Telephone Encounter (Signed)
°*  STAT* If patient is at the pharmacy, call can be transferred to refill team.   1. Which medications need to be refilled? (please list name of each medication and dose if known) eliquis   2. Which pharmacy/location (including street and city if local pharmacy) is medication to be sent to? [facility] on [address]  3. Do they need a 30 day or 90 day supply? 90 day

## 2018-01-24 MED ORDER — APIXABAN 5 MG PO TABS: 5.0000 mg | ORAL_TABLET | Freq: Two times a day (BID) | ORAL | 1 refills | Status: DC

## 2018-01-24 NOTE — Telephone Encounter (Signed)
Pt called back to check status of Eliquis. 90 day supply. Pt states she will run out of Tuesday

## 2018-01-24 NOTE — Telephone Encounter (Signed)
Pt is a 72 yr old female who last saw Dr End on 09/25/17, weight at that time was 75.8Kg. Last noted SCr was 0.74 on 08/09/18. Will refill Eliquis 5mg  BID for 90 day supply per pt request.

## 2018-03-25 NOTE — Progress Notes (Signed)
Follow-up Outpatient Visit Date: 03/26/2018  Primary Care Provider: Dion Body, MD [place] [facility] [place] [address]  Chief Complaint: Follow-up atrial fibrillation  HPI:  Carly Beltran is a 72 y.o. year-old female with history of paroxysmal atrial fibrillation, HTN, HLD, and kidney stones, who presents for follow-up of atrial fibrillation.  I last saw her in February, at which time she was doing well.  She noted occasional brief palpitations lasting only a few seconds, which were not reminiscent of what she experienced while in atrial fibrillation in November.  We did not make any medication changes at that time.  Today, Carly Beltran reports that she is feeling well.  She notes occasional sporadic palpitations lasting a few seconds at a time without accompanying symptoms.  She has also noticed that her heart rate is a little bit high when she checks it in the mornings, though this typically improves after taking her morning medications.  She denies chest pain and lightheadedness.  She has stable exertional dyspnea when walking up an incline.  She is able to walk regularly for 40 to 50 minutes at a time without significant limitations.  She notes occasional dependent edema, which has been long-standing and is unchanged.  She is tolerating her medications well without side effects.  She notes that her home blood pressures are typically in the normal range, down to the 120s over 70s.  --------------------------------------------------------------------------------------------------  Cardiovascular History & Procedures: Cardiovascular Problems:  Paroxysmal atrial fibrillation  Risk Factors:  Hypertension, hyperlipidemia, and age greater than 14  Cath/PCI:  None  CV Surgery:  None  EP Procedures and Devices:  None  Non-Invasive Evaluation(s):  Pharmacologic myocardial perfusion stress test (07/19/17): No evidence of ischemia or scar.   Hyperdynamic left ventricular contraction.  1 mm inferolateral ST depressions with regadenoson.  Transthoracic echocardiogram (07/02/17): Normal LV size and wall thickness.  LVEF 65-70% with normal wall motion.  Normal diastolic function.  Trivial aortic regurgitation.  Normal RV size and function.  Recent CV Pertinent Labs: Lab Results  Component Value Date   CHOL 193 07/02/2017   HDL 91 07/02/2017   LDLCALC 78 07/02/2017   TRIG 120 07/02/2017   CHOLHDL 2.1 07/02/2017   INR 0.98 07/02/2017   K 3.9 08/09/2017   MG 1.7 07/02/2017   BUN 19 08/09/2017   CREATININE 0.74 08/09/2017    Past medical and surgical history were reviewed and updated in EPIC.  Current Meds  Medication Sig  . apixaban (ELIQUIS) 5 MG TABS tablet Take 1 tablet (5 mg total) by mouth 2 (two) times daily.  Marland Kitchen atorvastatin (LIPITOR) 10 MG tablet Take 10 mg every evening by mouth.  . hydrochlorothiazide (HYDRODIURIL) 12.5 MG tablet Take 12.5 mg daily by mouth.   . Magnesium Oxide 400 (240 Mg) MG TABS Take 1 tablet (400 mg total) by mouth daily.  . metoprolol succinate (TOPROL-XL) 25 MG 24 hr tablet TAKE 3 TABLETS BY MOUTH ONCE DAILY (TAKE  WITH  OR  IMMEDIATELY  FOLLOWING  A  MEAL)  . Multiple Vitamin (MULTIVITAMIN) tablet Take 1 tablet by mouth daily. Reported on 09/12/2015  . potassium chloride (K-DUR) 10 MEQ tablet TAKE 1 TABLET BY MOUTH ONCE DAILY  . Probiotic Product (PROBIOTIC PO) Take by mouth daily.  . Saccharomyces boulardii (FLORASTOR PO) Take by mouth 3 (three) times daily.    Allergies: Ampicillin; Penicillins; and Septra [sulfamethoxazole-trimethoprim]  Social History   Tobacco Use  . Smoking status: Never Smoker  . Smokeless tobacco:  Never Used  Substance Use Topics  . Alcohol use: Yes    Alcohol/week: 1.0 standard drinks    Types: 1 Glasses of wine per week  . Drug use: No    Family History  Problem Relation Age of Onset  . Breast cancer Cousin   . Atrial fibrillation Mother   . CAD  Father   . CAD Brother        a. s/p 5 vessel CABG at age 61    Review of Systems: A 12-system review of systems was performed and was negative except as noted in the HPI.  --------------------------------------------------------------------------------------------------  Physical Exam: BP (!) 160/82 (BP Location: Left Arm, Patient Position: Sitting, Cuff Size: Normal)   Pulse 69   Ht 5' 6.5" (1.689 m)   Wt 163 lb 8 oz (74.2 kg)   BMI 25.99 kg/m   General: NAD. HEENT: No conjunctival pallor or scleral icterus. Moist mucous membranes.  OP clear. Neck: Supple without lymphadenopathy, thyromegaly, JVD, or HJR. Lungs: Normal work of breathing. Clear to auscultation bilaterally without wheezes or crackles. Heart: Regular rate and rhythm without murmurs, rubs, or gallops. Non-displaced PMI. Abd: Bowel sounds present. Soft, NT/ND without hepatosplenomegaly Ext: No lower extremity edema. Radial, PT, and DP pulses are 2+ bilaterally. Skin: Warm and dry without rash.  EKG: Normal sinus rhythm with nonspecific ST changes.  No change from prior tracing on 09/25/2017.  Lab Results  Component Value Date   WBC 3.7 08/09/2017   HGB 13.3 08/09/2017   HCT 40.6 08/09/2017   MCV 92.5 08/09/2017   PLT 130 (L) 08/09/2017    Lab Results  Component Value Date   NA 139 08/09/2017   K 3.9 08/09/2017   CL 105 08/09/2017   CO2 29 08/09/2017   BUN 19 08/09/2017   CREATININE 0.74 08/09/2017   GLUCOSE 96 08/09/2017   ALT 16 07/02/2017    Lab Results  Component Value Date   CHOL 193 07/02/2017   HDL 91 07/02/2017   LDLCALC 78 07/02/2017   TRIG 120 07/02/2017   CHOLHDL 2.1 07/02/2017    --------------------------------------------------------------------------------------------------  ASSESSMENT AND PLAN: Paroxysmal atrial fibrillation EKG today demonstrates sinus rhythm.  Patient reports occasional brief palpitations without accompanying symptoms.  I recommend that we continue her  current medications consisting of metoprolol succinate 75 mg daily and apixaban 5 mg twice daily (CHADSVASC score at least 3).  Hypertension Blood pressure moderately elevated on exam today, though home blood pressure readings are typically better.  I have recommended sodium restriction.  I will have Carly Beltran return in about 2 to 3 weeks for blood pressure check.  She will bring her home blood pressure cuff with her to ensure that readings are reliable.  In the meantime, she should continue with HCTZ and metoprolol.  Hyperlipidemia Lipids reasonably well controlled on last check in April (LDL 81).  Continue atorvastatin 10 mg daily under the direction of her PCP.  Thrombocytopenia Chronic and mild.  Most recent platelet check in April by her PCP showed stable platelet count around 130,000.  I think it is reasonable to continue her current medications, including apixaban.  She should continue to follow with her PCP.  Follow-up: Return for blood pressure check in approximately 2 to 3 weeks.  Return to see me in the office in 6 months.  Nelva Bush, MD 03/26/2018 10:09 AM

## 2018-03-26 ENCOUNTER — Ambulatory Visit: Payer: Medicare HMO | Admitting: Internal Medicine

## 2018-03-26 ENCOUNTER — Encounter: Payer: Self-pay | Admitting: Internal Medicine

## 2018-03-26 VITALS — BP 160/82 | HR 69 | Ht 66.5 in | Wt 163.5 lb

## 2018-03-26 DIAGNOSIS — D696 Thrombocytopenia, unspecified: Secondary | ICD-10-CM | POA: Diagnosis not present

## 2018-03-26 DIAGNOSIS — E785 Hyperlipidemia, unspecified: Secondary | ICD-10-CM

## 2018-03-26 DIAGNOSIS — I480 Paroxysmal atrial fibrillation: Secondary | ICD-10-CM

## 2018-03-26 DIAGNOSIS — I10 Essential (primary) hypertension: Secondary | ICD-10-CM

## 2018-03-26 NOTE — Patient Instructions (Signed)
Medication Instructions:  Your physician recommends that you continue on your current medications as directed. Please refer to the Current Medication list given to you today.   Labwork: none  Testing/Procedures: none  Follow-Up: Your physician recommends that you schedule a follow-up appointment in: 2 WEEKS FOR NURSE VISIT FOR BP CHECK. PLEASE BRING YOUR BLOOD PRESSURE CUFF FROM HOME.   Your physician recommends that you schedule a follow-up appointment in: [place].  If you need a refill on your cardiac medications before your next appointment, please call your pharmacy.

## 2018-04-09 ENCOUNTER — Ambulatory Visit (INDEPENDENT_AMBULATORY_CARE_PROVIDER_SITE_OTHER): Payer: Medicare HMO | Admitting: *Deleted

## 2018-04-09 VITALS — BP 140/72 | HR 60 | Ht 66.5 in | Wt 165.0 lb

## 2018-04-09 DIAGNOSIS — I10 Essential (primary) hypertension: Secondary | ICD-10-CM | POA: Diagnosis not present

## 2018-04-09 NOTE — Patient Instructions (Signed)
Medication Instructions: - Your physician recommends that you continue on your current medications as directed. Please refer to the Current Medication list given to you today.  Labwork: - none ordered  Procedures/Testing: - none ordered  Follow-Up: - as planned  Any Additional Special Instructions Will Be Listed Below (If Applicable).     If you need a refill on your cardiac medications before your next appointment, please call your pharmacy.

## 2018-04-09 NOTE — Progress Notes (Signed)
1.) Reason for visit: BP check  2.) Name of MD requesting visit: End  3.) H&P: The patient was recently seen by Dr. Saunders Revel on 03/26/18 in clinic for follow up of her atrial fibrillation. Her blood pressure at that visit was 160/82. However, she was reporting that her blood pressures were controlled on her home monitor. Dr. Saunders Revel recommended that the patient restrict her sodium intake and keep a record of her home BP readings. She was asked to come back today for a follow up BP check and correlation with her home monitor.  4.) ROS related to problem: The patient is without complaints today. She does bring in a list of home BP readings since 03/26/18 reading from 104-143/65-82. On average her SBP readings look to be mostly in the 110's- 120 range. On our manuel cuff in office today the patient's BP was 140/72 (60) left arm & on her home cuff she was 139/82 (65) left arm.   5.) Assessment and plan per MD: Reviewed the patient's BP results with Dr. Saunders Revel today. He recommends that the patient continue her current medications. No further changes made today. The patient is agreeable and voices understanding.

## 2018-06-06 ENCOUNTER — Other Ambulatory Visit: Payer: Self-pay | Admitting: Family Medicine

## 2018-06-06 DIAGNOSIS — Z1231 Encounter for screening mammogram for malignant neoplasm of breast: Secondary | ICD-10-CM

## 2018-07-17 ENCOUNTER — Other Ambulatory Visit: Payer: Self-pay | Admitting: *Deleted

## 2018-07-17 ENCOUNTER — Telehealth: Payer: Self-pay

## 2018-07-17 MED ORDER — POTASSIUM CHLORIDE ER 10 MEQ PO TBCR: 10.0000 meq | EXTENDED_RELEASE_TABLET | Freq: Every day | ORAL | 0 refills | Status: DC

## 2018-07-17 NOTE — Telephone Encounter (Signed)
Requested Prescriptions   Signed Prescriptions Disp Refills  . potassium chloride (K-DUR) 10 MEQ tablet 90 tablet 0    Sig: Take 1 tablet (10 mEq total) by mouth daily.    Authorizing Provider: END, CHRISTOPHER    Ordering User: Britt Bottom

## 2018-07-17 NOTE — Telephone Encounter (Signed)
*  STAT* If patient is at the pharmacy, call can be transferred to refill team.   1. Which medications need to be refilled? (please list name of each medication and dose if known)  Potassium  2. Which pharmacy/location (including street and city if local pharmacy) is medication to be sent to? [facility] on [address]   3. Do they need a 30 day or 90 day supply? 90 day

## 2018-08-18 ENCOUNTER — Other Ambulatory Visit: Payer: Self-pay | Admitting: Internal Medicine

## 2018-08-18 ENCOUNTER — Ambulatory Visit
Admission: RE | Admit: 2018-08-18 | Discharge: 2018-08-18 | Disposition: A | Discharge status: Home / Self Care | Payer: Medicare HMO | Source: Ambulatory Visit | Attending: Family Medicine | Admitting: Family Medicine

## 2018-08-18 DIAGNOSIS — Z1231 Encounter for screening mammogram for malignant neoplasm of breast: Secondary | ICD-10-CM | POA: Insufficient documentation

## 2018-08-18 NOTE — Telephone Encounter (Signed)
Refill Request.  

## 2018-09-22 NOTE — Progress Notes (Signed)
Follow-up Outpatient Visit Date: 09/24/2018  Primary Care Provider: Dion Body, MD [place] [facility] [place] [address]  Chief Complaint: Follow-up atrial fibrillation  HPI:  [name] is a 73 y.o. year-old female with history of  paroxysmal atrial fibrillation, HTN, HLD, and kidney stones, who presents for follow-up of atrial fibrillation.  I last saw her in August, at which time she was doing well.  She noted occasional sporadic palpitations lasting a few seconds at a time.  Blood pressure was mildly elevated, with subsequent home and follow-up office readings being better.  No medication changes were recommended.  They comes Schunk reports that she has been feeling relatively well.  She notes occasional palpitations without associated symptoms.  These last a few seconds at a time.  She also has occasional vague chest discomfort.  It is not exertional and typically lasts few minutes.  She exercises intermittently on her treadmill without difficulty.  She is tolerating her current medications well.  She has occasional dependent leg edema.  No orthopnea or PND.  Home blood pressures typically 563-875 systolic.  --------------------------------------------------------------------------------------------------  Cardiovascular History & Procedures: Cardiovascular Problems:  Paroxysmal atrial fibrillation  Risk Factors:  Hypertension, hyperlipidemia, and age greater than 27  Cath/PCI:  None  CV Surgery:  None  EP Procedures and Devices:  None  Non-Invasive Evaluation(s):  Pharmacologic myocardial perfusion stress test (07/19/17): No evidence of ischemia or scar. Hyperdynamic left ventricular contraction. 1 mm inferolateral ST depressions with regadenoson.  Transthoracic echocardiogram (07/02/17): Normal LV size and wall thickness. LVEF 65-70% with normal wall motion. Normal diastolic function. Trivial aortic regurgitation. Normal RV  size and function.  Recent CV Pertinent Labs: Lab Results  Component Value Date   CHOL 193 07/02/2017   HDL 91 07/02/2017   LDLCALC 78 07/02/2017   TRIG 120 07/02/2017   CHOLHDL 2.1 07/02/2017   INR 0.98 07/02/2017   K 3.9 08/09/2017   MG 1.7 07/02/2017   BUN 19 08/09/2017   CREATININE 0.74 08/09/2017    Past medical and surgical history were reviewed and updated in EPIC.  Current Meds  Medication Sig  . atorvastatin (LIPITOR) 10 MG tablet Take 10 mg every evening by mouth.  . cholecalciferol (VITAMIN D3) 25 MCG (1000 UT) tablet Take 1,000 Units by mouth daily.  Marland Kitchen ELIQUIS 5 MG TABS tablet TAKE 1 TABLET BY MOUTH TWICE DAILY  . hydrochlorothiazide (HYDRODIURIL) 12.5 MG tablet Take 12.5 mg daily by mouth.   . Magnesium Oxide 400 (240 Mg) MG TABS Take 1 tablet (400 mg total) by mouth daily.  . metoprolol succinate (TOPROL-XL) 25 MG 24 hr tablet Take 3 tablets (75 mg) by mouth once daily  . Multiple Vitamin (MULTIVITAMIN) tablet Take 1 tablet by mouth daily. Reported on 09/12/2015  . potassium chloride (K-DUR) 10 MEQ tablet Take 1 tablet (10 mEq total) by mouth daily.  . Probiotic Product (PROBIOTIC PO) Take by mouth daily.  Marland Kitchen saccharomyces boulardii (FLORASTOR) 250 MG capsule Take 2 capsules by mouth every morning & take 1 capsule by mouth every night    Allergies: Ampicillin; Penicillins; and Septra [sulfamethoxazole-trimethoprim]  Social History   Tobacco Use  . Smoking status: Never Smoker  . Smokeless tobacco: Never Used  Substance Use Topics  . Alcohol use: Yes    Alcohol/week: 1.0 standard drinks    Types: 1 Glasses of wine per week  . Drug use: No    Family History  Problem Relation Age of Onset  . Breast cancer  Cousin   . Atrial fibrillation Mother   . CAD Father   . CAD Brother        a. s/p 5 vessel CABG at age 55    Review of Systems: A 12-system review of systems was performed and was negative except as noted in the  HPI.  --------------------------------------------------------------------------------------------------  Physical Exam: BP 140/76 (BP Location: Left Arm, Patient Position: Sitting, Cuff Size: Normal)   Pulse 68   Ht 5' 6.5" (1.689 m)   Wt 161 lb 12 oz (73.4 kg)   BMI 25.72 kg/m   General: NAD. HEENT: No conjunctival pallor or scleral icterus. Moist mucous membranes.  OP clear. Neck: Supple without lymphadenopathy, thyromegaly, JVD, or HJR. Lungs: Normal work of breathing. Clear to auscultation bilaterally without wheezes or crackles. Heart: Regular rate and rhythm without murmurs, rubs, or gallops. Non-displaced PMI. Abd: Bowel sounds present. Soft, NT/ND without hepatosplenomegaly Ext: No lower extremity edema. Radial, PT, and DP pulses are 2+ bilaterally. Skin: Warm and dry without rash.  EKG:  NSR with non-specific ST changes.  No significant change from prior tracing on 03/27/18.  Lab Results  Component Value Date   WBC 3.7 08/09/2017   HGB 13.3 08/09/2017   HCT 40.6 08/09/2017   MCV 92.5 08/09/2017   PLT 130 (L) 08/09/2017    Lab Results  Component Value Date   NA 139 08/09/2017   K 3.9 08/09/2017   CL 105 08/09/2017   CO2 29 08/09/2017   BUN 19 08/09/2017   CREATININE 0.74 08/09/2017   GLUCOSE 96 08/09/2017   ALT 16 07/02/2017    Lab Results  Component Value Date   CHOL 193 07/02/2017   HDL 91 07/02/2017   LDLCALC 78 07/02/2017   TRIG 120 07/02/2017   CHOLHDL 2.1 07/02/2017    --------------------------------------------------------------------------------------------------  ASSESSMENT AND PLAN: Paroxysmal atrial fibrillation Occasional brief palpitations without worrisome symptoms noted.  This may reflect brief paroxysms of atrial fibrillation versus atrial runs.  We will continue current medications including metoprolol and apixaban (CHADSVASC at least 3).  I am hesitant to escalate metoprolol further at this time given resting heart rate in the 60s.   Labs through her PCP at [facility] in August were unremarkable.  Atypical chest pain Not exertional and most likely noncardiac.  Myocardial perfusion stress test in 07/2017 was normal.  No further work-up at this time unless symptoms worsen.  Hypertension Blood pressure mildly elevated today but typically better at home.  I encouraged sodium restriction.  We discussed escalation of HCTZ and/or metoprolol but have agreed to defer this in favor of dietary changes and continued home blood pressure monitoring.  Follow-up: Return to clinic in 1 year.  Nelva Bush, MD 09/24/2018 11:04 AM

## 2018-09-24 ENCOUNTER — Encounter: Payer: Self-pay | Admitting: Internal Medicine

## 2018-09-24 ENCOUNTER — Ambulatory Visit: Payer: Medicare HMO | Admitting: Internal Medicine

## 2018-09-24 VITALS — BP 140/76 | HR 68 | Ht 66.5 in | Wt 161.8 lb

## 2018-09-24 DIAGNOSIS — R0789 Other chest pain: Secondary | ICD-10-CM | POA: Diagnosis not present

## 2018-09-24 DIAGNOSIS — I480 Paroxysmal atrial fibrillation: Secondary | ICD-10-CM | POA: Diagnosis not present

## 2018-09-24 DIAGNOSIS — I10 Essential (primary) hypertension: Secondary | ICD-10-CM

## 2018-09-24 NOTE — Patient Instructions (Signed)
Medication Instructions:  Your physician recommends that you continue on your current medications as directed. Please refer to the Current Medication list given to you today.  If you need a refill on your cardiac medications before your next appointment, please call your pharmacy.   Lab work: none If you have labs (blood work) drawn today and your tests are completely normal, you will receive your results only by: . MyChart Message (if you have MyChart) OR . A paper copy in the mail If you have any lab test that is abnormal or we need to change your treatment, we will call you to review the results.  Testing/Procedures: none  Follow-Up: At [facility], you and your health needs are our priority.  As part of our continuing mission to provide you with exceptional heart care, we have created designated Provider Care Teams.  These Care Teams include your primary Cardiologist (physician) and Advanced Practice Providers (APPs -  Physician Assistants and Nurse Practitioners) who all work together to provide you with the care you need, when you need it. You will need a follow up appointment in 12 months.  Please call our office 2 months in advance to schedule this appointment.  You may see DR CHRISTOPHER END or one of the following Advanced Practice Providers on your designated Care Team:   Christopher Berge, NP Ryan Dunn, PA-C . Jacquelyn Visser, PA-C    

## 2018-10-06 ENCOUNTER — Other Ambulatory Visit: Payer: Self-pay | Admitting: Internal Medicine

## 2018-11-03 ENCOUNTER — Other Ambulatory Visit: Payer: Self-pay | Admitting: *Deleted

## 2018-11-03 MED ORDER — METOPROLOL SUCCINATE ER 25 MG PO TB24: ORAL_TABLET | ORAL | 3 refills | Status: DC

## 2018-11-06 ENCOUNTER — Other Ambulatory Visit: Payer: Self-pay | Admitting: Internal Medicine

## 2018-11-06 MED ORDER — APIXABAN 5 MG PO TABS: 5.0000 mg | ORAL_TABLET | Freq: Two times a day (BID) | ORAL | 3 refills | Status: DC

## 2018-11-06 NOTE — Telephone Encounter (Signed)
Please review for refill.  

## 2018-11-06 NOTE — Telephone Encounter (Signed)
°*  STAT* If patient is at the pharmacy, call can be transferred to refill team.   1. Which medications need to be refilled? (please list name of each medication and dose if known) Eliquis 5 MG   2. Which pharmacy/location (including street and city if local pharmacy) is medication to be sent to? [facility] on [address]   3. Do they need a 30 day or 90 day supply? 90 day

## 2018-11-06 NOTE — Telephone Encounter (Signed)
[facility]ARE [facility] ([facility]) Scr = 0.7 on 04/08/18  Last OV DR End on 09-24-2018

## 2018-11-07 ENCOUNTER — Other Ambulatory Visit: Payer: Self-pay | Admitting: *Deleted

## 2018-11-07 NOTE — Telephone Encounter (Signed)
Eliquis 5mg  paper refill received from [facility] in [place]. Pt is 73 yrs old, wt-73.4kg, Crea-0.70 on 10/07/2018, last seen by Dr. Saunders Revel on 09/24/2018; refill was sent yesterday.

## 2018-12-15 ENCOUNTER — Other Ambulatory Visit: Payer: Self-pay | Admitting: Internal Medicine

## 2019-03-16 ENCOUNTER — Other Ambulatory Visit: Payer: Self-pay | Admitting: Internal Medicine

## 2019-03-16 NOTE — Telephone Encounter (Signed)
Pt's age 73, wt 73.4 kg, SCr 0.7, CrCl 85.71, last ov w CE 09/24/18.

## 2019-06-23 ENCOUNTER — Other Ambulatory Visit: Payer: Self-pay | Admitting: Family Medicine

## 2019-06-23 DIAGNOSIS — Z1231 Encounter for screening mammogram for malignant neoplasm of breast: Secondary | ICD-10-CM

## 2019-06-29 ENCOUNTER — Other Ambulatory Visit: Payer: Self-pay | Admitting: Internal Medicine

## 2019-08-21 ENCOUNTER — Ambulatory Visit
Admission: RE | Admit: 2019-08-21 | Discharge: 2019-08-21 | Disposition: A | Discharge status: Home / Self Care | Payer: Medicare HMO | Source: Ambulatory Visit | Attending: Family Medicine | Admitting: Family Medicine

## 2019-08-21 DIAGNOSIS — Z1231 Encounter for screening mammogram for malignant neoplasm of breast: Secondary | ICD-10-CM | POA: Diagnosis not present

## 2019-08-24 ENCOUNTER — Other Ambulatory Visit: Payer: Self-pay | Admitting: Internal Medicine

## 2019-08-24 NOTE — Telephone Encounter (Signed)
Pt's age 74, wt 73.4 kg, SCr 0.7, CrCl 84.18, last ov w/ CE 09/24/18- has appt on 09/30/19.

## 2019-08-24 NOTE — Telephone Encounter (Signed)
Refill request for Eliquis

## 2019-09-25 NOTE — Progress Notes (Signed)
Follow-up Outpatient Visit Date: 09/30/2019  Primary Care Provider: Dion Body, MD [facility] [place] [address]  Chief Complaint: Follow-up atrial fibrillation  HPI:  Ms. Kohring is a 74 y.o. female with history of paroxysmal atrial fibrillation, hypertension, hyperlipidemia, and kidney stones, who presents for follow-up of atrial fibrillation.  I last saw her a year ago, at which time Ms. Angelillo was feeling relatively well.  She reported occasional palpitations lasting a few seconds at a time as well as sporadic vague chest discomfort.  She did not have any exertional symptoms.  Prior myocardial perfusion stress test was low risk without ischemia or scar.  We did not make any medication changes at that time or pursue additional testing.  Today, Ms. Impellizzeri reports that she feels well.  She has noticed a few episodes of palpitations with elevated heart rates just over 100 bpm.  This typically resolves when her medications "kick in."  She denies chest pain, lightheadedness, and edema.  She has stable exertional dyspnea when she "overdoes it" that has been stable for at least a year.  Home blood pressures are typically well controlled in the 110 to [id number] mmHg systolic range.  She notes that HCTZ was increased to 25 mg daily by her PCP at their last visit in 04/2019.  --------------------------------------------------------------------------------------------------  Cardiovascular History & Procedures: Cardiovascular Problems:  Paroxysmal atrial fibrillation  Risk Factors:  Hypertension, hyperlipidemia, and age greater than 60  Cath/PCI:  None  CV Surgery:  None  EP Procedures and Devices:  None  Non-Invasive Evaluation(s):  Pharmacologic myocardial perfusion stress test (07/19/17): No evidence of ischemia or scar. Hyperdynamic left ventricular contraction. 1 mm inferolateral ST depressions with regadenoson.  Transthoracic  echocardiogram (07/02/17): Normal LV size and wall thickness. LVEF 65-70% with normal wall motion. Normal diastolic function. Trivial aortic regurgitation. Normal RV size and function.  Recent CV Pertinent Labs: Lab Results  Component Value Date   CHOL 193 07/02/2017   HDL 91 07/02/2017   LDLCALC 78 07/02/2017   TRIG 120 07/02/2017   CHOLHDL 2.1 07/02/2017   INR 0.98 07/02/2017   K 3.9 08/09/2017   MG 1.7 07/02/2017   BUN 19 08/09/2017   CREATININE 0.74 08/09/2017    Past medical and surgical history were reviewed and updated in EPIC.  Current Meds  Medication Sig  . atorvastatin (LIPITOR) 10 MG tablet Take 10 mg every evening by mouth.  . cholecalciferol (VITAMIN D3) 25 MCG (1000 UT) tablet Take 1,000 Units by mouth daily.  Marland Kitchen ELIQUIS 5 MG TABS tablet Take 1 tablet by mouth twice daily  . fluticasone (FLONASE) 50 MCG/ACT nasal spray Place 2 sprays into both nostrils daily.   . hydrochlorothiazide (HYDRODIURIL) 25 MG tablet Take 25 mg by mouth daily.   . Magnesium Oxide 400 (240 Mg) MG TABS Take 1 tablet (400 mg total) by mouth daily.  . metoprolol succinate (TOPROL-XL) 25 MG 24 hr tablet Take 3 tablets (75 mg) by mouth once daily  . Multiple Vitamin (MULTIVITAMIN) tablet Take 1 tablet by mouth daily. Reported on 09/12/2015  . potassium chloride (KLOR-CON) 10 MEQ tablet Take 1 tablet (10 mEq total) by mouth daily.  . Probiotic Product (PROBIOTIC PO) Take by mouth daily.  Marland Kitchen saccharomyces boulardii (FLORASTOR) 250 MG capsule Take 2 capsules by mouth every morning & take 1 capsule by mouth every night    Allergies: Ampicillin, Penicillins, and Septra [sulfamethoxazole-trimethoprim]  Social History   Tobacco Use  . Smoking status: Never  Smoker  . Smokeless tobacco: Never Used  Substance Use Topics  . Alcohol use: Yes    Alcohol/week: 1.0 standard drinks    Types: 1 Glasses of wine per week  . Drug use: No    Family History  Problem Relation Age of Onset  . Breast  cancer Cousin   . Atrial fibrillation Mother   . CAD Father   . CAD Brother        a. s/p 5 vessel CABG at age 3    Review of Systems: Ms. Gaglione notes rash on both legs and wonders if these could be petechiae.  She has a history of chronic thrombocytopenia.  Otherwise, a 12-system review of systems was performed and was negative except as noted in the HPI.  --------------------------------------------------------------------------------------------------  Physical Exam: BP (!) 160/80 (BP Location: Left Arm, Patient Position: Sitting, Cuff Size: Normal)   Pulse 74   Ht 5' 6.5" (1.689 m)   Wt 164 lb 4 oz (74.5 kg)   SpO2 98%   BMI 26.11 kg/m   General: NAD. HEENT: No conjunctival pallor or scleral icterus. Facemask in place. Neck: No JVD or HJR. Lungs: Normal work of breathing. Clear to auscultation bilaterally without wheezes or crackles. Heart: Regular rate and rhythm without murmurs, rubs, or gallops. Non-displaced PMI. Abd: Bowel sounds present. Soft, NT/ND without hepatosplenomegaly Ext: No lower extremity edema. Radial, PT, and DP pulses are 2+ bilaterally. Skin: Warm and dry.  Scattered hyperpigmented spots on both legs.  Few petechiae also noted on both shins.  EKG: Normal sinus rhythm with possible left atrial enlargement.  Otherwise, no significant abnormality.  Lab Results  Component Value Date   WBC 3.7 08/09/2017   HGB 13.3 08/09/2017   HCT 40.6 08/09/2017   MCV 92.5 08/09/2017   PLT 130 (L) 08/09/2017    Lab Results  Component Value Date   NA 139 08/09/2017   K 3.9 08/09/2017   CL 105 08/09/2017   CO2 29 08/09/2017   BUN 19 08/09/2017   CREATININE 0.74 08/09/2017   GLUCOSE 96 08/09/2017   ALT 16 07/02/2017    Lab Results  Component Value Date   CHOL 193 07/02/2017   HDL 91 07/02/2017   LDLCALC 78 07/02/2017   TRIG 120 07/02/2017   CHOLHDL 2.1 07/02/2017     --------------------------------------------------------------------------------------------------  ASSESSMENT AND PLAN: Paroxysmal atrial fibrillation: Brief, sporadic palpitations noted.  EKG today shows sinus rhythm.  Continue current dose of metoprolol as well as anticoagulation with apixaban.  Dyspnea on exertion: Longstanding and stable.  No further work-up at this time.  Hypertension: Blood pressure suboptimally controlled today but typically much better at home.  Ms. Lupton suspects there is an element of whitecoat hypertension.  We have agreed to defer medication changes today, though if blood pressure remains elevated when she sees her PCP next month, escalation of therapy will need to be considered.  Thrombocytopenia: Ms. Schnell has a history of mild thrombocytopenia and notes some petechiae on her legs.  She is unsure of how long they may have been present.  She otherwise does not have significant bruising or bleeding.  I suggested that we check a CBC today, though she would like to defer this until she has labs drawn in the next week or 2 in anticipation of her PCP follow-up.  I think this is reasonable.  I advised her to contact us or her PCP immediately if she develops worsening petechiae, bruising, or bleeding in the meantime.  Follow-up: Return to  clinic in 1 year.  Nelva Bush, MD 09/30/2019 9:19 AM

## 2019-09-28 ENCOUNTER — Other Ambulatory Visit: Payer: Self-pay

## 2019-09-28 ENCOUNTER — Other Ambulatory Visit: Payer: Self-pay | Admitting: Internal Medicine

## 2019-09-28 MED ORDER — POTASSIUM CHLORIDE ER 10 MEQ PO TBCR: 10.0000 meq | EXTENDED_RELEASE_TABLET | Freq: Every day | ORAL | 0 refills | Status: DC

## 2019-09-30 ENCOUNTER — Ambulatory Visit (INDEPENDENT_AMBULATORY_CARE_PROVIDER_SITE_OTHER): Payer: Medicare HMO | Admitting: Internal Medicine

## 2019-09-30 ENCOUNTER — Encounter: Payer: Self-pay | Admitting: Internal Medicine

## 2019-09-30 ENCOUNTER — Other Ambulatory Visit: Payer: Self-pay

## 2019-09-30 VITALS — BP 160/80 | HR 74 | Ht 66.5 in | Wt 164.2 lb

## 2019-09-30 DIAGNOSIS — R0600 Dyspnea, unspecified: Secondary | ICD-10-CM

## 2019-09-30 DIAGNOSIS — I480 Paroxysmal atrial fibrillation: Secondary | ICD-10-CM | POA: Diagnosis not present

## 2019-09-30 DIAGNOSIS — R0609 Other forms of dyspnea: Secondary | ICD-10-CM | POA: Insufficient documentation

## 2019-09-30 DIAGNOSIS — D696 Thrombocytopenia, unspecified: Secondary | ICD-10-CM | POA: Diagnosis not present

## 2019-09-30 DIAGNOSIS — I10 Essential (primary) hypertension: Secondary | ICD-10-CM | POA: Diagnosis not present

## 2019-09-30 NOTE — Patient Instructions (Addendum)
Medication Instructions:  Your physician recommends that you continue on your current medications as directed. Please refer to the Current Medication list given to you today.  *If you need a refill on your cardiac medications before your next appointment, please call your pharmacy*  Lab Work: none If you have labs (blood work) drawn today and your tests are completely normal, you will receive your results only by: Marland Kitchen MyChart Message (if you have MyChart) OR . A paper copy in the mail If you have any lab test that is abnormal or we need to change your treatment, we will call you to review the results.  Testing/Procedures: none  Follow-Up: At [facility], you and your health needs are our priority.  As part of our continuing mission to provide you with exceptional heart care, we have created designated Provider Care Teams.  These Care Teams include your primary Cardiologist (physician) and Advanced Practice Providers (APPs -  Physician Assistants and Nurse Practitioners) who all work together to provide you with the care you need, when you need it.  Your next appointment:   12 month(s)  The format for your next appointment:   In Person  Provider:    You may see DR Harrell Gave END or one of the following Advanced Practice Providers on your designated Care Team:    Murray Hodgkins, NP  Christell Faith, PA-C  Marrianne Mood, PA-C

## 2019-11-03 ENCOUNTER — Other Ambulatory Visit: Payer: Self-pay

## 2019-11-03 MED ORDER — METOPROLOL SUCCINATE ER 25 MG PO TB24: ORAL_TABLET | ORAL | 3 refills | Status: DC

## 2019-12-07 ENCOUNTER — Other Ambulatory Visit: Payer: Self-pay | Admitting: Internal Medicine

## 2019-12-07 NOTE — Telephone Encounter (Signed)
Pt's age 74, wt 74.5 kg, SCr 0.8, CrCl 74.76, last ov w/ CE 09/30/19.

## 2019-12-07 NOTE — Telephone Encounter (Signed)
Refill request for Eliquis

## 2020-01-06 ENCOUNTER — Telehealth: Payer: Self-pay | Admitting: Internal Medicine

## 2020-01-06 MED ORDER — POTASSIUM CHLORIDE ER 10 MEQ PO TBCR: 10.0000 meq | EXTENDED_RELEASE_TABLET | Freq: Every day | ORAL | 3 refills | Status: DC

## 2020-01-06 NOTE — Telephone Encounter (Signed)
Ok to send in the Potassium for 90 day supply. Patient was seen in 2/21 with 1 year follow up. It was most likely a message from previous refill request when patient needed a follow up. Rx for 90 days with refills sent to pharmacy.  Patient verbalized understanding and was appreciative.

## 2020-01-06 NOTE — Telephone Encounter (Signed)
Patient states her pharmacy denied her 90 day refill, states she needs to be seem. Pharmacist only gave her 30 day supply and states she needed to be seen. Recall is in for 12 month follow up from February 2021. Please call to discuss.

## 2020-06-06 ENCOUNTER — Other Ambulatory Visit: Payer: Self-pay | Admitting: Internal Medicine

## 2020-06-06 NOTE — Telephone Encounter (Signed)
Pt's age 74, wt 74.5 kg, SCr 0.7, CrCrl 84.18, last ov w/ CE 09/30/19.

## 2020-06-16 ENCOUNTER — Other Ambulatory Visit: Payer: Self-pay | Admitting: Family Medicine

## 2020-06-16 DIAGNOSIS — Z1231 Encounter for screening mammogram for malignant neoplasm of breast: Secondary | ICD-10-CM

## 2020-08-22 ENCOUNTER — Other Ambulatory Visit: Payer: Self-pay

## 2020-08-22 ENCOUNTER — Ambulatory Visit
Admission: RE | Admit: 2020-08-22 | Discharge: 2020-08-22 | Disposition: A | Discharge status: Home / Self Care | Payer: Medicare HMO | Source: Ambulatory Visit | Attending: Family Medicine | Admitting: Family Medicine

## 2020-08-22 DIAGNOSIS — Z1231 Encounter for screening mammogram for malignant neoplasm of breast: Secondary | ICD-10-CM | POA: Diagnosis present

## 2020-08-31 ENCOUNTER — Other Ambulatory Visit: Payer: Self-pay | Admitting: Internal Medicine

## 2020-10-06 ENCOUNTER — Encounter: Payer: Self-pay | Admitting: Internal Medicine

## 2020-10-06 ENCOUNTER — Ambulatory Visit: Payer: Medicare HMO | Admitting: Internal Medicine

## 2020-10-06 ENCOUNTER — Other Ambulatory Visit: Payer: Self-pay

## 2020-10-06 VITALS — BP 150/90 | HR 73 | Ht 67.75 in | Wt 161.0 lb

## 2020-10-06 DIAGNOSIS — I480 Paroxysmal atrial fibrillation: Secondary | ICD-10-CM

## 2020-10-06 DIAGNOSIS — I10 Essential (primary) hypertension: Secondary | ICD-10-CM

## 2020-10-06 NOTE — Patient Instructions (Signed)
Medication Instructions:  Your physician recommends that you continue on your current medications as directed. Please refer to the Current Medication list given to you today.  *If you need a refill on your cardiac medications before your next appointment, please call your pharmacy*  Follow-Up: At [facility], you and your health needs are our priority.  As part of our continuing mission to provide you with exceptional heart care, we have created designated Provider Care Teams.  These Care Teams include your primary Cardiologist (physician) and Advanced Practice Providers (APPs -  Physician Assistants and Nurse Practitioners) who all work together to provide you with the care you need, when you need it.  We recommend signing up for the patient portal called "MyChart".  Sign up information is provided on this After Visit Summary.  MyChart is used to connect with patients for Virtual Visits (Telemedicine).  Patients are able to view lab/test results, encounter notes, upcoming appointments, etc.  Non-urgent messages can be sent to your provider as well.   To learn more about what you can do with MyChart, go to [email or web address].    Your next appointment:   12 month(s)  The format for your next appointment:   In Person  Provider:   You may see DR CHRISTOPHER END or one of the following Advanced Practice Providers on your designated Care Team:    Christopher Berge, NP  Ryan Dunn, PA-C  Jacquelyn Visser, PA-C  Cadence Furth, PA-C  Caitlin Walker, NP  

## 2020-10-06 NOTE — Progress Notes (Unsigned)
Follow-up Outpatient Visit Date: 10/06/2020  Primary Care Provider: Dion Body, MD [facility] [place] [address]  Chief Complaint: Follow-up atrial fibrillation  HPI:  Carly Beltran is a 75 y.o. female with history of paroxysmal atrial fibrillation, hypertension, hyperlipidemia, and kidney stones, who presents for follow-up of atrial fibrillation.  I last saw her a year ago, at which time she was feeling well with only a few episodes of palpitations.  Blood pressure was elevated at that visit, which she attributed to whitecoat hypertension.  We agreed to defer medication changes and additional testing.  Today, Carly Beltran reports that she has been doing fairly well.  She believes that she had an episode of atrial fibrillation in late December that lasted for 2 to 3 hours.  She felt her heart racing and measured heart rates up to 141 bpm.  She took 50 mg of metoprolol succinate early that day with improvement in her heart rate.  She otherwise reports only brief sporadic palpitations without associated symptoms.  She has not had any chest pain or lightheadedness.  She has stable exertional dyspnea when walking uphill.  She notes mild dependent edema at times.  She has had a couple of mild nosebleeds where she sees streaks of blood in her nose.  She has not had any prolonged epistaxis.  She remains compliant with her medications.  Home blood pressure readings are typically normal, as she attributes elevated reading today to whitecoat hypertension that has been a longstanding issue for her.  --------------------------------------------------------------------------------------------------  Cardiovascular History & Procedures: Cardiovascular Problems:  Paroxysmal atrial fibrillation  Risk Factors:  Hypertension, hyperlipidemia, and age greater than 26  Cath/PCI:  None  CV Surgery:  None  EP Procedures and Devices:  None  Non-Invasive  Evaluation(s):  Pharmacologic myocardial perfusion stress test (07/19/17): No evidence of ischemia or scar. Hyperdynamic left ventricular contraction. 1 mm inferolateral ST depressions with regadenoson.  Transthoracic echocardiogram (07/02/17): Normal LV size and wall thickness. LVEF 65-70% with normal wall motion. Normal diastolic function. Trivial aortic regurgitation. Normal RV size and function.   Recent CV Pertinent Labs: Lab Results  Component Value Date   CHOL 193 07/02/2017   HDL 91 07/02/2017   LDLCALC 78 07/02/2017   TRIG 120 07/02/2017   CHOLHDL 2.1 07/02/2017   INR 0.98 07/02/2017   K 3.9 08/09/2017   MG 1.7 07/02/2017   BUN 19 08/09/2017   CREATININE 0.74 08/09/2017    Past medical and surgical history were reviewed and updated in EPIC.  Current Meds  Medication Sig  . atorvastatin (LIPITOR) 10 MG tablet Take 10 mg every evening by mouth.  . cholecalciferol (VITAMIN D3) 25 MCG (1000 UT) tablet Take 1,000 Units by mouth daily.  Marland Kitchen ELIQUIS 5 MG TABS tablet Take 1 tablet by mouth twice daily  . fluticasone (FLONASE) 50 MCG/ACT nasal spray Place 2 sprays into both nostrils daily.   . fluticasone (FLONASE) 50 MCG/ACT nasal spray Place 2 sprays into both nostrils daily as needed for allergies or rhinitis.  . hydrochlorothiazide (HYDRODIURIL) 25 MG tablet Take 25 mg by mouth daily.   . Magnesium Oxide 400 (240 Mg) MG TABS Take 1 tablet (400 mg total) by mouth daily.  . metoprolol succinate (TOPROL-XL) 25 MG 24 hr tablet Take 3 tablets (75 mg) by mouth once daily  . Multiple Vitamin (MULTIVITAMIN) tablet Take 1 tablet by mouth daily. Reported on 09/12/2015  . potassium chloride (KLOR-CON) 10 MEQ tablet Take 1 tablet (10 mEq  total) by mouth daily.  . Probiotic Product (PROBIOTIC PO) Take by mouth daily.    Allergies: Ampicillin, Penicillins, and Septra [sulfamethoxazole-trimethoprim]  Social History   Tobacco Use  . Smoking status: Never Smoker  . Smokeless  tobacco: Never Used  Vaping Use  . Vaping Use: Never used  Substance Use Topics  . Alcohol use: Yes    Alcohol/week: 1.0 standard drink    Types: 1 Glasses of wine per week    Comment: weekly  . Drug use: No    Family History  Problem Relation Age of Onset  . Breast cancer Cousin   . Atrial fibrillation Mother   . CAD Father   . CAD Brother        a. s/p 5 vessel CABG at age 84    Review of Systems: A 12-system review of systems was performed and was negative except as noted in the HPI.  --------------------------------------------------------------------------------------------------  Physical Exam: BP (!) 150/90 (BP Location: Left Arm, Patient Position: Sitting, Cuff Size: Normal)   Pulse 73   Ht 5' 7.75" (1.721 m)   Wt 161 lb (73 kg)   SpO2 95%   BMI 24.66 kg/m   General:  NAD. Neck: No JVD or HJR. Lungs: Clear to auscultation bilaterally without wheezes or crackles. Heart: Regular rate and rhythm without murmurs, rubs, or gallops. Abdomen: Soft, nontender, nondistended. Extremities: No lower extremity edema.  EKG: Normal sinus rhythm with nonspecific ST segment changes similar to prior tracing from 09/30/2019.  Lab Results  Component Value Date   WBC 3.7 08/09/2017   HGB 13.3 08/09/2017   HCT 40.6 08/09/2017   MCV 92.5 08/09/2017   PLT 130 (L) 08/09/2017    Lab Results  Component Value Date   NA 139 08/09/2017   K 3.9 08/09/2017   CL 105 08/09/2017   CO2 29 08/09/2017   BUN 19 08/09/2017   CREATININE 0.74 08/09/2017   GLUCOSE 96 08/09/2017   ALT 16 07/02/2017    Lab Results  Component Value Date   CHOL 193 07/02/2017   HDL 91 07/02/2017   LDLCALC 78 07/02/2017   TRIG 120 07/02/2017   CHOLHDL 2.1 07/02/2017    --------------------------------------------------------------------------------------------------  ASSESSMENT AND PLAN: Paroxysmal atrial fibrillation: Overall, only mild symptoms every reported over the last year.  We have agreed  to continue metoprolol succinate 75 mg daily and apixaban 5 mg twice daily (CHA2DS2-VASc score 3).  Labs through her PCP last September showed stable renal/liver function and blood counts.  Hypertension: Home blood pressure readings typically normal.  Blood pressure is mildly elevated today, which Carly Beltran attributes to whitecoat hypertension.  She notes that her blood pressure cuff has previously been correlated with readings in the office.  We will defer medication changes today.  Continued sodium restriction was reinforced.  Thrombocytopenia: Chronic and mild with most recent platelet count in 04/2020 being 145,000.  Ongoing follow-up per PCP.  I think is reasonable to continue anticoagulation at this time given absence of significant bleeding.  Follow-up: Return to clinic in 1 year.  Nelva Bush, MD 10/06/2020 11:31 AM

## 2020-10-07 ENCOUNTER — Encounter: Payer: Self-pay | Admitting: Internal Medicine

## 2020-10-21 ENCOUNTER — Telehealth: Payer: Self-pay | Admitting: Internal Medicine

## 2020-10-21 MED ORDER — METOPROLOL SUCCINATE ER 25 MG PO TB24
ORAL_TABLET | ORAL | 3 refills | Status: DC
Start: 2020-10-21 — End: 2021-10-16

## 2020-10-21 NOTE — Telephone Encounter (Signed)
Received fax from [facility] on [address] in [place] requesting refills for Metoprolol Succinate 25mg . Rx request sent to pharmacy.

## 2020-11-21 ENCOUNTER — Other Ambulatory Visit: Payer: Self-pay | Admitting: Internal Medicine

## 2020-11-21 NOTE — Telephone Encounter (Signed)
Pt's age 75, wt 54 kg, SCr 0.7, CrCl 82.49, last ov w/ CE 10/06/20.

## 2021-01-17 ENCOUNTER — Other Ambulatory Visit: Payer: Self-pay | Admitting: Internal Medicine

## 2021-01-26 ENCOUNTER — Telehealth: Payer: Self-pay | Admitting: Internal Medicine

## 2021-01-26 NOTE — Telephone Encounter (Signed)
    Carly Beltran DOB:  12/27/1945  MRN:  [id number]   Primary Cardiologist: Nelva Bush, MD  Chart reviewed as part of pre-operative protocol coverage. Given past medical history and time since last visit, based on ACC/AHA guidelines, Carly Beltran would be at acceptable risk for the planned procedure without further cardiovascular testing.   Patient with diagnosis of afib on Eliquis for anticoagulation.     Procedure: colonoscopy Date of procedure: 05/02/21   CHA2DS2-VASc Score = 3  This indicates a 3.2% annual risk of stroke. The patient's score is based upon: CHF History: No HTN History: Yes Diabetes History: No Stroke History: No Vascular Disease History: No Age Score: 1 Gender Score: 1    CrCl 74mL/min Platelet count 130K   Per office protocol, patient can hold Eliquis for 1-2 days prior to procedure.    The patient was advised that if she develops new symptoms prior to surgery to contact our office to arrange for a follow-up visit, and she verbalized understanding.  I will route this recommendation to the requesting party via Epic fax function and remove from pre-op pool.  Please call with questions.  Kathyrn Drown, NP 01/26/2021, 1:12 PM

## 2021-01-26 NOTE — Telephone Encounter (Signed)
Patient with diagnosis of afib on Eliquis for anticoagulation.    Procedure: colonoscopy Date of procedure: 05/02/21  CHA2DS2-VASc Score = 3  This indicates a 3.2% annual risk of stroke. The patient's score is based upon: CHF History: No HTN History: Yes Diabetes History: No Stroke History: No Vascular Disease History: No Age Score: 1 Gender Score: 1   CrCl 51mL/min Platelet count 130K  Per office protocol, patient can hold Eliquis for 1-2 days prior to procedure.

## 2021-01-26 NOTE — Telephone Encounter (Signed)
   [facility] Pre-operative Risk Assessment    Patient Name: Carly Beltran  DOB: 1945/09/07  MRN: [id number]   HEARTCARE STAFF: - Please ensure there is not already an duplicate clearance open for this procedure. - Under Visit Info/Reason for Call, type in Other and utilize the format Clearance MM/DD/YY or Clearance TBD. Do not use dashes or single digits. - If request is for dental extraction, please clarify the # of teeth to be extracted. - If the patient is currently at the dentist's office, call Pre-Op APP to address. If the patient is not currently in the dentist office, please route to the Pre-Op pool  Request for surgical clearance:  What type of surgery is being performed? colonoscopy   When is this surgery scheduled? 05/02/21   What type of clearance is required (medical clearance vs. Pharmacy clearance to hold med vs. Both)? both  Are there any medications that need to be held prior to surgery and how long?eliquis pleaase advise    Practice name and name of physician performing surgery? [facility] Dr. Haig Prophet    What is the office phone number? [phone]   7.   What is the office fax number? [phone]  8.   Anesthesia type (None, local, MAC, general) ? Not noted    Clarisse Gouge 01/26/2021, 7:09 AM  _________________________________________________________________   (provider comments below)

## 2021-03-03 ENCOUNTER — Other Ambulatory Visit: Payer: Self-pay | Admitting: Internal Medicine

## 2021-03-03 NOTE — Telephone Encounter (Signed)
Refill Request.  

## 2021-03-03 NOTE — Telephone Encounter (Signed)
Eliquis '5mg'$  refill request received. Patient is 75 years old, weight-73kg, Crea-0.70 on 11/21/20 via [facility] at [place], [place], and last seen by Dr. Saunders Revel on 10/06/2020. Dose is appropriate based on dosing criteria. Will send in refill to requested pharmacy.

## 2021-04-15 ENCOUNTER — Other Ambulatory Visit: Payer: Self-pay | Admitting: Internal Medicine

## 2021-05-02 ENCOUNTER — Other Ambulatory Visit: Payer: Self-pay

## 2021-05-02 ENCOUNTER — Ambulatory Visit: Payer: Medicare HMO | Admitting: Registered Nurse

## 2021-05-02 ENCOUNTER — Ambulatory Visit
Admission: RE | Admit: 2021-05-02 | Discharge: 2021-05-02 | Disposition: A | Discharge status: Home / Self Care | Payer: Medicare HMO | Attending: Gastroenterology | Admitting: Gastroenterology

## 2021-05-02 ENCOUNTER — Encounter: Payer: Self-pay | Admitting: *Deleted

## 2021-05-02 ENCOUNTER — Encounter
Admission: RE | Disposition: A | Discharge status: Home / Self Care | Payer: Self-pay | Source: Home / Self Care | Attending: Gastroenterology

## 2021-05-02 DIAGNOSIS — Z881 Allergy status to other antibiotic agents status: Secondary | ICD-10-CM | POA: Diagnosis not present

## 2021-05-02 DIAGNOSIS — Z7901 Long term (current) use of anticoagulants: Secondary | ICD-10-CM | POA: Insufficient documentation

## 2021-05-02 DIAGNOSIS — K529 Noninfective gastroenteritis and colitis, unspecified: Secondary | ICD-10-CM | POA: Diagnosis present

## 2021-05-02 DIAGNOSIS — Z880 Allergy status to penicillin: Secondary | ICD-10-CM | POA: Insufficient documentation

## 2021-05-02 DIAGNOSIS — I10 Essential (primary) hypertension: Secondary | ICD-10-CM | POA: Insufficient documentation

## 2021-05-02 DIAGNOSIS — K635 Polyp of colon: Secondary | ICD-10-CM | POA: Insufficient documentation

## 2021-05-02 DIAGNOSIS — K5730 Diverticulosis of large intestine without perforation or abscess without bleeding: Secondary | ICD-10-CM | POA: Diagnosis not present

## 2021-05-02 DIAGNOSIS — Z79899 Other long term (current) drug therapy: Secondary | ICD-10-CM | POA: Diagnosis not present

## 2021-05-02 DIAGNOSIS — Z882 Allergy status to sulfonamides status: Secondary | ICD-10-CM | POA: Insufficient documentation

## 2021-05-02 HISTORY — PX: COLONOSCOPY WITH PROPOFOL: SHX5780

## 2021-05-02 SURGERY — COLONOSCOPY WITH PROPOFOL
Anesthesia: General

## 2021-05-02 MED ORDER — PHENYLEPHRINE HCL (PRESSORS) 10 MG/ML IV SOLN
INTRAVENOUS | Status: AC
  Filled 2021-05-02: qty 1

## 2021-05-02 MED ORDER — PROPOFOL 500 MG/50ML IV EMUL
INTRAVENOUS | Status: DC | PRN
  Administered 2021-05-02: 120 ug/kg/min via INTRAVENOUS

## 2021-05-02 MED ORDER — PROPOFOL 10 MG/ML IV BOLUS
INTRAVENOUS | Status: AC
  Filled 2021-05-02: qty 40

## 2021-05-02 MED ORDER — PROPOFOL 10 MG/ML IV BOLUS
INTRAVENOUS | Status: DC | PRN
  Administered 2021-05-02: 30 mg via INTRAVENOUS
  Administered 2021-05-02: 20 mg via INTRAVENOUS
  Administered 2021-05-02: 70 mg via INTRAVENOUS

## 2021-05-02 MED ORDER — PROPOFOL 500 MG/50ML IV EMUL
INTRAVENOUS | Status: AC
  Filled 2021-05-02: qty 50

## 2021-05-02 MED ORDER — SODIUM CHLORIDE 0.9 % IV SOLN: INTRAVENOUS | Status: DC

## 2021-05-02 NOTE — Anesthesia Preprocedure Evaluation (Signed)
Anesthesia Evaluation  Patient identified by MRN, date of birth, ID band Patient awake    Reviewed: Allergy & Precautions, H&P , NPO status , Patient's Chart, lab work & pertinent test results, reviewed documented beta blocker date and time   History of Anesthesia Complications Negative for: history of anesthetic complications  Airway Mallampati: II  TM Distance: >3 FB Neck ROM: full    Dental  (+) Caps, Dental Advidsory Given, Teeth Intact   Pulmonary neg pulmonary ROS,    Pulmonary exam normal breath sounds clear to auscultation       Cardiovascular Exercise Tolerance: Good hypertension, On Medications and On Home Beta Blockers (-) angina(-) CAD, (-) Past MI, (-) Cardiac Stents and (-) CABG + dysrhythmias Atrial Fibrillation + Valvular Problems/Murmurs MVP  Rhythm:regular Rate:Normal     Neuro/Psych negative neurological ROS  negative psych ROS   GI/Hepatic Neg liver ROS, GERD  ,  Endo/Other  negative endocrine ROS  Renal/GU Renal disease (stones)  negative genitourinary   Musculoskeletal   Abdominal   Peds  Hematology negative hematology ROS (+)   Anesthesia Other Findings Past Medical History:   Motion sickness                                                Comment:Reading in car   GERD (gastroesophageal reflux disease)                       Cough                                                          Comment:due to reflux   Environmental allergies                                      Hypertension                                                 MVP (mitral valve prolapse)                                    Comment:followed by PCP   Dysrhythmia                                                    Comment:palpatations (occasionally)   Hypercholesteremia                                           Kidney stones  Arthritis                                                       Comment:1 finger   Reproductive/Obstetrics negative OB ROS                             Anesthesia Physical  Anesthesia Plan  ASA: 2  Anesthesia Plan: General   Post-op Pain Management:    Induction: Intravenous  PONV Risk Score and Plan: TIVA and Propofol infusion  Airway Management Planned: Natural Airway and Nasal Cannula  Additional Equipment:   Intra-op Plan:   Post-operative Plan:   Informed Consent: I have reviewed the patients History and Physical, chart, labs and discussed the procedure including the risks, benefits and alternatives for the proposed anesthesia with the patient or authorized representative who has indicated his/her understanding and acceptance.     Dental Advisory Given  Plan Discussed with: Anesthesiologist, CRNA and Surgeon  Anesthesia Plan Comments:         Anesthesia Quick Evaluation

## 2021-05-02 NOTE — H&P (Signed)
Outpatient short stay form Pre-procedure 05/02/2021  Lesly Rubenstein, MD  Primary Physician: Dion Body, MD  Reason for visit:  Chronic Diarrhea  History of present illness:   75 y/o lady with history of hypertension and chronic diarrhea after being on PPI here for colonoscopy. Last took eliquis 3 days ago. No family history of GI malignancies. No significant abdominal surgeries. Had small TA on last colonoscopy done 5 years ago.    Current Facility-Administered Medications:    0.9 %  sodium chloride infusion, , Intravenous, Continuous, Penne Rosenstock, Hilton Cork, MD, Last Rate: 20 mL/hr at 05/02/21 0840, New Bag at 05/02/21 0840  Medications Prior to Admission  Medication Sig Dispense Refill Last Dose   atorvastatin (LIPITOR) 10 MG tablet Take 10 mg every evening by mouth.   05/01/2021   cholecalciferol (VITAMIN D3) 25 MCG (1000 UT) tablet Take 1,000 Units by mouth daily.   05/01/2021   fluticasone (FLONASE) 50 MCG/ACT nasal spray Place 2 sprays into both nostrils daily.    Past Week   fluticasone (FLONASE) 50 MCG/ACT nasal spray Place 2 sprays into both nostrils daily as needed for allergies or rhinitis.   Past Week   hydrochlorothiazide (HYDRODIURIL) 25 MG tablet Take 25 mg by mouth daily.    05/01/2021   Magnesium Oxide 400 (240 Mg) MG TABS Take 1 tablet (400 mg total) by mouth daily. 30 tablet 0 05/01/2021   metoprolol succinate (TOPROL-XL) 25 MG 24 hr tablet Take 3 tablets (75 mg) by mouth once daily 270 tablet 3 05/01/2021   Multiple Vitamin (MULTIVITAMIN) tablet Take 1 tablet by mouth daily. Reported on 09/12/2015   05/01/2021   potassium chloride (KLOR-CON) 10 MEQ tablet TAKE 1  BY MOUTH ONCE DAILY 90 tablet 2 05/01/2021   Probiotic Product (PROBIOTIC PO) Take by mouth daily.   05/01/2021   apixaban (ELIQUIS) 5 MG TABS tablet Take 1 tablet by mouth twice daily 180 tablet 1 04/29/2021 at 1800     Allergies  Allergen Reactions   Ampicillin Rash   Penicillins Rash    Has patient  had a PCN reaction causing immediate rash, facial/tongue/throat swelling, SOB or lightheadedness with hypotension: No Has patient had a PCN reaction causing severe rash involving mucus membranes or skin necrosis: No Has patient had a PCN reaction that required hospitalization: No Has patient had a PCN reaction occurring within the last 10 years: No If all of the above answers are "NO", then may proceed with Cephalosporin use.    Septra [Sulfamethoxazole-Trimethoprim] Rash     Past Medical History:  Diagnosis Date   Arthritis    1 finger   Cough    due to reflux   Dysrhythmia    Environmental allergies    GERD (gastroesophageal reflux disease)    Hypercholesteremia    Hypertension    Kidney stones    Motion sickness    Reading in car   MVP (mitral valve prolapse)    a. 06/2017 not noted on echo.   PAF (paroxysmal atrial fibrillation) ([place])    a. Diagnosed with Afib 07/02/2017; b. CHADS2VASc -> 3 (HTN, age x 1, female) -> Eliquis; c. 06/2017 Echo: EF 65-70%, no rwma, triv AI. Nl RV fxn.    Review of systems:  Otherwise negative.    Physical Exam  Gen: Alert, oriented. Appears stated age.  HEENT: PERRLA. Lungs: No respiratory distress CV: RRR Abd: soft, benign, no masses Ext: No edema    Planned procedures: Proceed with colonoscopy. The patient understands the nature  of the planned procedure, indications, risks, alternatives and potential complications including but not limited to bleeding, infection, perforation, damage to internal organs and possible oversedation/side effects from anesthesia. The patient agrees and gives consent to proceed.  Please refer to procedure notes for findings, recommendations and patient disposition/instructions.     Lesly Rubenstein, MD [facility]

## 2021-05-02 NOTE — Op Note (Addendum)
[facility] Gastroenter[facility] Patient Name: Carly Beltran Procedure Date: 05/02/2021 8:55 AM MRN: [id number] Account #: [id number] Date of Birth: 25-Feb-1946 Admit Type: Outpatient Age: 75 Room: [facility] ENDO ROOM 1 Gender: Female Note Status: Finalized Instrument Name: Colonoscope [id number] Procedure:             Colonoscopy Indications:           Chronic diarrhea Providers:             Andrey Farmer MD, MD Referring MD:          Dion Body (Referring MD) Medicines:             Monitored Anesthesia Care Complications:         No immediate complications. Estimated blood loss:                         Minimal. Procedure:             Pre-Anesthesia Assessment:                        - Prior to the procedure, a History and Physical was                         performed, and patient medications and allergies were                         reviewed. The patient is competent. The risks and                         benefits of the procedure and the sedation options and                         risks were discussed with the patient. All questions                         were answered and informed consent was obtained.                         Patient identification and proposed procedure were                         verified by the physician, the nurse, the anesthetist                         and the technician in the endoscopy suite. Mental                         Status Examination: alert and oriented. Airway                         Examination: normal oropharyngeal airway and neck                         mobility. Respiratory Examination: clear to                         auscultation. CV Examination: normal. Prophylactic  Antibiotics: The patient does not require prophylactic                         antibiotics. Prior Anticoagulants: The patient has                         taken Eliquis (apixaban), last dose was 3 days prior                          to procedure. ASA Grade Assessment: II - A patient                         with mild systemic disease. After reviewing the risks                         and benefits, the patient was deemed in satisfactory                         condition to undergo the procedure. The anesthesia                         plan was to use monitored anesthesia care (MAC).                         Immediately prior to administration of medications,                         the patient was re-assessed for adequacy to receive                         sedatives. The heart rate, respiratory rate, oxygen                         saturations, blood pressure, adequacy of pulmonary                         ventilation, and response to care were monitored                         throughout the procedure. The physical status of the                         patient was re-assessed after the procedure.                        After obtaining informed consent, the colonoscope was                         passed under direct vision. Throughout the procedure,                         the patient's blood pressure, pulse, and oxygen                         saturations were monitored continuously. The                         Colonoscope was introduced through the anus and  advanced to the the terminal ileum. The colonoscopy                         was performed without difficulty. The patient                         tolerated the procedure well. The quality of the bowel                         preparation was good. Findings:      The perianal and digital rectal examinations were normal.      The terminal ileum appeared normal.      Normal mucosa was found in the entire colon. Biopsies for histology were       taken with a cold forceps from the entire colon for evaluation of       microscopic colitis. Estimated blood loss was minimal.      A 1 mm polyp was found in the rectum. The polyp was sessile. The polyp        was removed with a jumbo cold forceps. Resection and retrieval were       complete. Estimated blood loss was minimal.      Multiple small-mouthed diverticula were found in the sigmoid colon,       descending colon, transverse colon, hepatic flexure and ascending colon.      The exam was otherwise without abnormality on direct and retroflexion       views. Impression:            - The examined portion of the ileum was normal.                        - Normal mucosa in the entire examined colon. Biopsied.                        - One 1 mm polyp in the rectum, removed with a jumbo                         cold forceps. Resected and retrieved.                        - Diverticulosis in the sigmoid colon, in the                         descending colon, in the transverse colon, at the                         hepatic flexure and in the ascending colon.                        - The examination was otherwise normal on direct and                         retroflexion views. Recommendation:        - Discharge patient to home.                        - Resume previous diet.                        -  Continue present medications.                        - Await pathology results.                        - Repeat colonoscopy is not recommended due to current                         age (59 years or older) for surveillance.                        - Return to referring physician as previously                         scheduled.                        - Resume Eliquis (apixaban) at prior dose today. Procedure Code(s):     --- Professional ---                        [phone], Colonoscopy, flexible; with biopsy, single or                         multiple Diagnosis Code(s):     --- Professional ---                        K62.1, Rectal polyp                        K52.9, Noninfective gastroenteritis and colitis,                         unspecified                        K57.30, Diverticulosis of large intestine  without                         perforation or abscess without bleeding CPT copyright 2019 American Medical Association. All rights reserved. The codes documented in this report are preliminary and upon coder review may  be revised to meet current compliance requirements. Andrey Farmer MD, MD 05/02/2021 9:21:24 AM Number of Addenda: 0 Note Initiated On: 05/02/2021 8:55 AM Scope Withdrawal Time: 0 hours 9 minutes 40 seconds  Total Procedure Duration: 0 hours 15 minutes 43 seconds  Estimated Blood Loss:  Estimated blood loss was minimal.      [facility]

## 2021-05-02 NOTE — Transfer of Care (Signed)
Immediate Anesthesia Transfer of Care Note  Patient: Carly Beltran  Procedure(s) Performed: COLONOSCOPY WITH PROPOFOL  Patient Location: PACU and Endoscopy Unit  Anesthesia Type:General  Level of Consciousness: drowsy  Airway & Oxygen Therapy: Patient Spontanous Breathing  Post-op Assessment: Report given to RN and Post -op Vital signs reviewed and stable  Post vital signs: stable  Last Vitals:  Vitals Value Taken Time  BP    Temp    Pulse    Resp    SpO2      Last Pain:  Vitals:   05/02/21 0814  TempSrc: Temporal  PainSc: 0-No pain         Complications: No notable events documented.

## 2021-05-02 NOTE — Interval H&P Note (Signed)
History and Physical Interval Note:  05/02/2021 8:54 AM  Carly Beltran  has presented today for surgery, with the diagnosis of diarrhea, personal history colon polyps.  The various methods of treatment have been discussed with the patient and family. After consideration of risks, benefits and other options for treatment, the patient has consented to  Procedure(s): COLONOSCOPY WITH PROPOFOL (N/A) as a surgical intervention.  The patient's history has been reviewed, patient examined, no change in status, stable for surgery.  I have reviewed the patient's chart and labs.  Questions were answered to the patient's satisfaction.     Lesly Rubenstein  Ok to proceed with colonoscopy

## 2021-05-02 NOTE — Anesthesia Postprocedure Evaluation (Signed)
Anesthesia Post Note  Patient: Carly Beltran  Procedure(s) Performed: COLONOSCOPY WITH PROPOFOL  Patient location during evaluation: Endoscopy Anesthesia Type: General Level of consciousness: awake and alert Pain management: pain level controlled Vital Signs Assessment: post-procedure vital signs reviewed and stable Respiratory status: spontaneous breathing, nonlabored ventilation, respiratory function stable and patient connected to nasal cannula oxygen Cardiovascular status: blood pressure returned to baseline and stable Postop Assessment: no apparent nausea or vomiting Anesthetic complications: no   No notable events documented.   Last Vitals:  Vitals:   05/02/21 0814 05/02/21 0920  BP: (!) 160/82 (!) 112/54  Pulse: 80 69  Resp: 17 (!) 22  Temp: (!) 36.3 C (!) 35.6 C  SpO2: 100% 100%    Last Pain:  Vitals:   05/02/21 0920  TempSrc: Temporal  PainSc:                  [name]

## 2021-05-03 ENCOUNTER — Encounter: Payer: Self-pay | Admitting: Gastroenterology

## 2021-05-03 LAB — SURGICAL PATHOLOGY

## 2021-06-20 ENCOUNTER — Other Ambulatory Visit: Payer: Self-pay | Admitting: Family Medicine

## 2021-06-20 DIAGNOSIS — Z1231 Encounter for screening mammogram for malignant neoplasm of breast: Secondary | ICD-10-CM

## 2021-08-28 ENCOUNTER — Other Ambulatory Visit: Payer: Self-pay | Admitting: Internal Medicine

## 2021-08-28 NOTE — Telephone Encounter (Signed)
Refill request

## 2021-08-28 NOTE — Telephone Encounter (Signed)
Eliquis 5 mg refill request received. Patient is 76 years old, weight- 72.6 kg, Crea- 0.7 on 05/01/21, Diagnosis- afib, and last seen by Dr. Saunders Revel on 10/06/20. Dose is appropriate based on dosing criteria. Will send in refill to requested pharmacy.

## 2021-09-15 ENCOUNTER — Ambulatory Visit
Admission: RE | Admit: 2021-09-15 | Discharge: 2021-09-15 | Disposition: A | Discharge status: Home / Self Care | Payer: Medicare HMO | Source: Ambulatory Visit | Attending: Family Medicine | Admitting: Family Medicine

## 2021-09-15 ENCOUNTER — Other Ambulatory Visit: Payer: Self-pay

## 2021-09-15 DIAGNOSIS — Z1231 Encounter for screening mammogram for malignant neoplasm of breast: Secondary | ICD-10-CM | POA: Insufficient documentation

## 2021-09-21 ENCOUNTER — Other Ambulatory Visit: Payer: Self-pay | Admitting: Family Medicine

## 2021-09-21 DIAGNOSIS — R928 Other abnormal and inconclusive findings on diagnostic imaging of breast: Secondary | ICD-10-CM

## 2021-09-21 DIAGNOSIS — N6489 Other specified disorders of breast: Secondary | ICD-10-CM

## 2021-10-05 ENCOUNTER — Ambulatory Visit
Admission: RE | Admit: 2021-10-05 | Discharge: 2021-10-05 | Disposition: A | Discharge status: Home / Self Care | Payer: Medicare HMO | Source: Ambulatory Visit | Attending: Family Medicine | Admitting: Family Medicine

## 2021-10-05 ENCOUNTER — Other Ambulatory Visit: Payer: Self-pay

## 2021-10-05 DIAGNOSIS — R928 Other abnormal and inconclusive findings on diagnostic imaging of breast: Secondary | ICD-10-CM

## 2021-10-05 DIAGNOSIS — N6489 Other specified disorders of breast: Secondary | ICD-10-CM | POA: Diagnosis present

## 2021-10-12 ENCOUNTER — Ambulatory Visit: Payer: Medicare HMO | Admitting: Internal Medicine

## 2021-10-12 ENCOUNTER — Other Ambulatory Visit: Payer: Self-pay

## 2021-10-12 ENCOUNTER — Encounter: Payer: Self-pay | Admitting: Internal Medicine

## 2021-10-12 VITALS — BP 150/84 | HR 67 | Ht 65.75 in | Wt 159.0 lb

## 2021-10-12 DIAGNOSIS — I480 Paroxysmal atrial fibrillation: Secondary | ICD-10-CM

## 2021-10-12 DIAGNOSIS — I10 Essential (primary) hypertension: Secondary | ICD-10-CM | POA: Diagnosis not present

## 2021-10-12 DIAGNOSIS — I872 Venous insufficiency (chronic) (peripheral): Secondary | ICD-10-CM | POA: Insufficient documentation

## 2021-10-12 NOTE — Progress Notes (Signed)
? ?Follow-up Outpatient Visit ?Date: 10/12/2021 ? ?Primary Care Provider: ?Dion Body, MD ?[facility] [place] ?[place] [address] ? ?Chief Complaint: Follow-up atrial fibrillation ? ?HPI:  [name] is a 76 y.o. female with history of paroxysmal atrial fibrillation, hypertension, hyperlipidemia, and kidney stones, who presents for follow-up of atrial fibrillation.  I last saw her a year ago, which time she was feeling fairly well.  She believes she had had an episode of atrial fibrillation in 07/2020 that lasted few hours that resolved after she took her dose of metoprolol succinate early.  She noted sporadic mild epistaxis but was otherwise feeling well.  We did not make any medication changes or pursue additional testing. ? ?Today, Carly Beltran reports that she feels well.  She notes sporadic skipped beats or a rapid heartbeat that only lasts a few seconds.  There are no associated symptoms.  She denies chest pain, shortness of breath, and lightheadedness.  She is concerned about blood "pooling" in her feet when she sits still for extended periods.  Her feet become discolored with some mild associated tingling.  She denies edema.  Tingling and discoloration rapidly resolve when she elevates her legs or begins to walk.  She walks regularly on the treadmill without any difficulty.  She remains on apixaban without bleeding. ? ?-------------------------------------------------------------------------------------------------- ? ?Cardiovascular History & Procedures: ?Cardiovascular Problems: ?Paroxysmal atrial fibrillation ?  ?Risk Factors: ?Hypertension, hyperlipidemia, and age greater than 46 ?  ?Cath/PCI: ?None ?  ?CV Surgery: ?None ?  ?EP Procedures and Devices: ?None ?  ?Non-Invasive Evaluation(s): ?Pharmacologic myocardial perfusion stress test (07/19/17): No evidence of ischemia or scar.  Hyperdynamic left ventricular contraction.  1 mm inferolateral ST depressions with  regadenoson. ?Transthoracic echocardiogram (07/02/17): Normal LV size and wall thickness.  LVEF 65-70% with normal wall motion.  Normal diastolic function.  Trivial aortic regurgitation.  Normal RV size and function. ?  ? ?Recent CV Pertinent Labs: ?Lab Results  ?Component Value Date  ? CHOL 193 07/02/2017  ? HDL 91 07/02/2017  ? [place] 78 07/02/2017  ? TRIG 120 07/02/2017  ? CHOLHDL 2.1 07/02/2017  ? INR 0.98 07/02/2017  ? K 3.9 08/09/2017  ? MG 1.7 07/02/2017  ? BUN 19 08/09/2017  ? CREATININE 0.74 08/09/2017  ? ? ?Past medical and surgical history were reviewed and updated in EPIC. ? ?Current Meds  ?Medication Sig  ? apixaban (ELIQUIS) 5 MG TABS tablet Take 1 tablet by mouth twice daily  ? atorvastatin (LIPITOR) 10 MG tablet Take 10 mg every evening by mouth.  ? cholecalciferol (VITAMIN D3) 25 MCG (1000 UT) tablet Take 1,000 Units by mouth daily.  ? fluticasone (FLONASE) 50 MCG/ACT nasal spray Place 2 sprays into both nostrils daily as needed for allergies or rhinitis.  ? hydrochlorothiazide (HYDRODIURIL) 25 MG tablet Take 25 mg by mouth daily.   ? Magnesium Oxide 400 (240 Mg) MG TABS Take 1 tablet (400 mg total) by mouth daily.  ? metoprolol succinate (TOPROL-XL) 25 MG 24 hr tablet Take 3 tablets (75 mg) by mouth once daily  ? Multiple Vitamin (MULTIVITAMIN) tablet Take 1 tablet by mouth daily. Reported on 09/12/2015  ? potassium chloride (KLOR-CON) 10 MEQ tablet TAKE 1  BY MOUTH ONCE DAILY  ? Probiotic Product (PROBIOTIC PO) Take by mouth daily.  ? ? ?Allergies: Ampicillin, Penicillins, and Septra [sulfamethoxazole-trimethoprim] ? ?Social History  ? ?Tobacco Use  ? Smoking status: Never  ? Smokeless tobacco: Never  ?Vaping Use  ? Vaping Use: Never  used  ?Substance Use Topics  ? Alcohol use: Not Currently  ?  Comment: 4 oz wine weekly  ? Drug use: No  ? ? ?Family History  ?Problem Relation Age of Onset  ? Atrial fibrillation Mother   ? CAD Father   ? Heart disease Brother   ? CAD Brother   ?     a. s/p 5 vessel  CABG at age 13  ? Breast cancer Cousin   ? ? ?Review of Systems: ?A 12-system review of systems was performed and was negative except as noted in the HPI. ? ?-------------------------------------------------------------------------------------------------- ? ?Physical Exam: ?BP (!) 150/84 (BP Location: Left Arm, Patient Position: Sitting, Cuff Size: Normal)   Pulse 67   Ht 5' 5.75" (1.67 m)   Wt 159 lb (72.1 kg)   SpO2 98%   BMI 25.86 kg/m?  ? ?General:  NAD. ?Neck: No JVD or HJR. ?Lungs: Clear to auscultation bilaterally without wheezes or crackles. ?Heart: Regular rate and rhythm without murmurs, rubs, or gallops. ?Abdomen: Soft, nontender, nondistended. ?Extremities: No lower extremity edema.  Varicose veins noted in both ankles.  2+ PT and DP pulses bilaterally. ? ?EKG:  Normal sinus rhythm without abnormality. ? ?Outside labs: ?CBC (05/01/2021): WBC 4.1, Hgb 14.1, HCT 43.4, platelets 144 ? ?CMP (05/01/2021): Sodium 142, potassium 4.2, chloride 103, CO2 31, BUN 18, creatinine 0.7, glucose 93, calcium 10.5, AST 24, ALT 13, alkaline phosphatase 84, total bilirubin 0.7, total protein 6.5, albumin 4.5 ? ?Lipid panel (05/01/2021): Total cholesterol 188, triglycerides 66, HDL 100, LDL 75 ? ?-------------------------------------------------------------------------------------------------- ? ?ASSESSMENT AND PLAN: ?Paroxysmal atrial fibrillation: ?Minimal, brief palpitations reported.  Continue current regimen of apixaban and metoprolol.  Labs last fall through PCP appropriate. ? ?Venous insufficiency: ?Dependent discoloration of feet consistent with venous insufficiency.  Varicose veins noted in both ankles on exam today.  I recommended leg elevation when possible as well as prn use of OTC compression stockings.  If symptoms worsen, referral to vein specialist could be considered.  No claudication reported; pedal pulses brisk on exam today. ? ?Hypertension: ?Blood pressure mildly elevated today but typically normal or  borderline low (Ms. Marsiglia reports h/o white coat hypertension).  Continue current medications and home BP monitoring. ? ?Follow-up: Return to clinic in 1 year. ? ?Nelva Bush, MD ?10/12/2021 ?9:25 AM ? ?

## 2021-10-12 NOTE — Patient Instructions (Signed)
Medication Instructions:  ? ?Your physician recommends that you continue on your current medications as directed. Please refer to the Current Medication list given to you today. ? ?*If you need a refill on your cardiac medications before your next appointment, please call your pharmacy* ? ? ?Lab Work: ? ?None ordered ? ?Testing/Procedures: ? ?None ordered ? ? ?Follow-Up: ?At [facility], you and your health needs are our priority.  As part of our continuing mission to provide you with exceptional heart care, we have created designated Provider Care Teams.  These Care Teams include your primary Cardiologist (physician) and Advanced Practice Providers (APPs -  Physician Assistants and Nurse Practitioners) who all work together to provide you with the care you need, when you need it. ? ?We recommend signing up for the patient portal called "MyChart".  Sign up information is provided on this After Visit Summary.  MyChart is used to connect with patients for Virtual Visits (Telemedicine).  Patients are able to view lab/test results, encounter notes, upcoming appointments, etc.  Non-urgent messages can be sent to your provider as well.   ?To learn more about what you can do with MyChart, go to [email or web address].   ? ?Your next appointment:   ?1 year(s) ? ?The format for your next appointment:   ?In Person ? ?Provider:   ?You may see Nelva Bush, MD or one of the following Advanced Practice Providers on your designated Care Team:   ?Murray Hodgkins, NP ?Christell Faith, PA-C ?Cadence Kathlen Mody, PA-C}  ? ? ?Other Instructions ? ?If you continue to have discoloration in feet you may wear over-the-counter compression socks to help with venous insufficiency. ? ?

## 2021-10-16 ENCOUNTER — Other Ambulatory Visit: Payer: Self-pay | Admitting: Internal Medicine

## 2022-01-12 ENCOUNTER — Other Ambulatory Visit: Payer: Self-pay | Admitting: Internal Medicine

## 2022-02-17 ENCOUNTER — Other Ambulatory Visit: Payer: Self-pay | Admitting: Internal Medicine

## 2022-02-19 NOTE — Telephone Encounter (Signed)
Refill Request.  

## 2022-02-19 NOTE — Telephone Encounter (Signed)
Prescription refill request for Eliquis received. Indication: PAF Last office visit: 10/12/21  C End MD Scr: 0.7 on 10/30/21 Age: 76 Weight: 72.1kg  Based on above findings Eliquis '5mg'$  twice daily is the appropriate dose.  Refill approved.

## 2022-04-09 ENCOUNTER — Other Ambulatory Visit: Payer: Self-pay | Admitting: Internal Medicine

## 2022-06-03 IMAGING — MG MM DIGITAL SCREENING BILAT W/ TOMO AND CAD
8 series · 9 of 24 positions shown · non-contrast
Comparison: Previous exam(s).

CLINICAL DATA: Screening.

EXAM:
DIGITAL SCREENING BILATERAL MAMMOGRAM WITH TOMOSYNTHESIS AND CAD
TECHNIQUE: Bilateral screening digital craniocaudal and mediolateral oblique
mammograms were obtained. Bilateral screening digital breast
tomosynthesis was performed. The images were evaluated with
computer-aided detection.

[L MLO synth-2D]
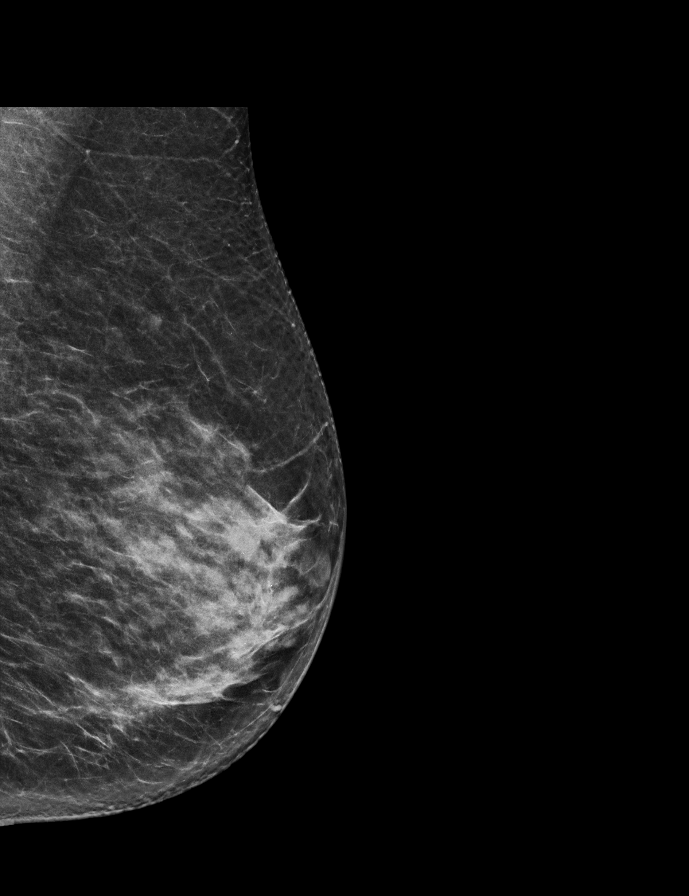

[R CC synth-2D]
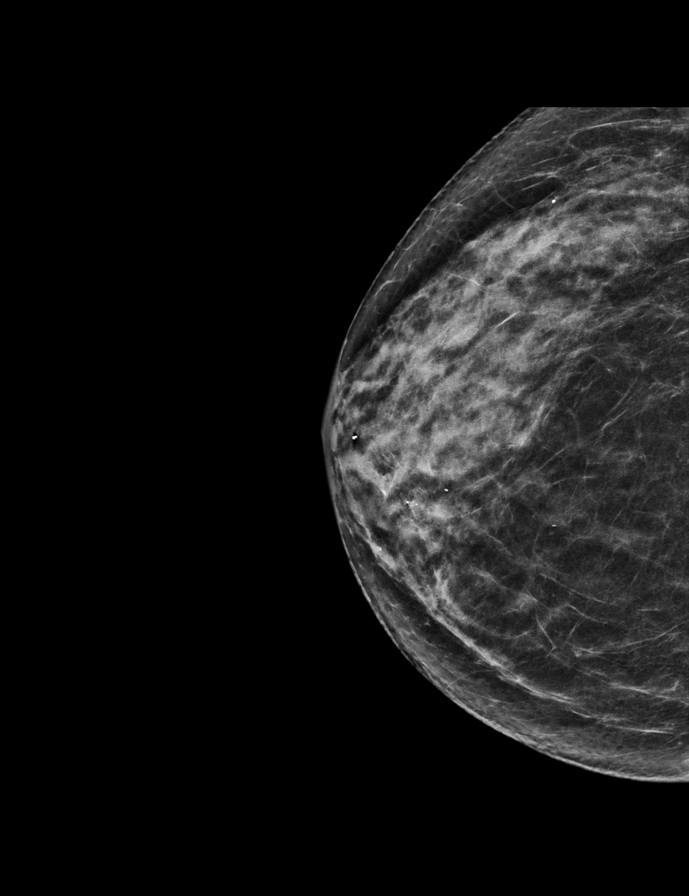

[R MLO synth-2D]
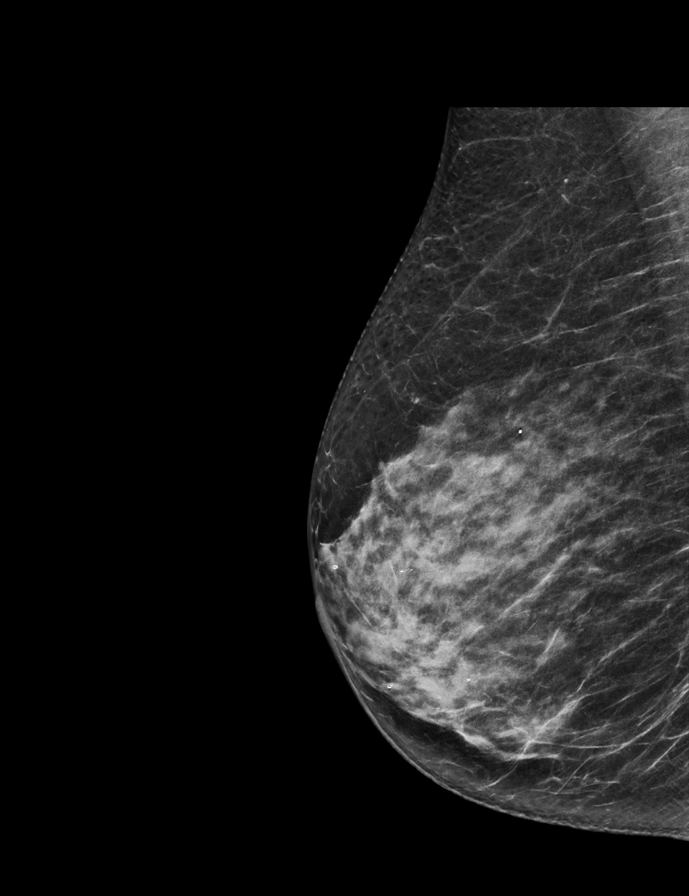

[L CC synth-2D]
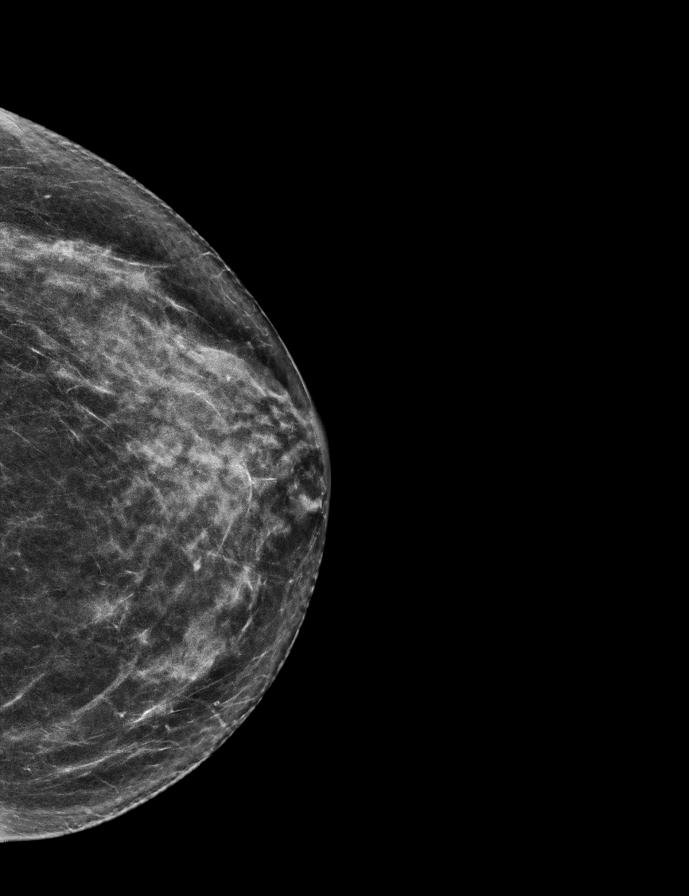

[R MLO tomo · 2 of 65 frames shown]
[frame 21/65]
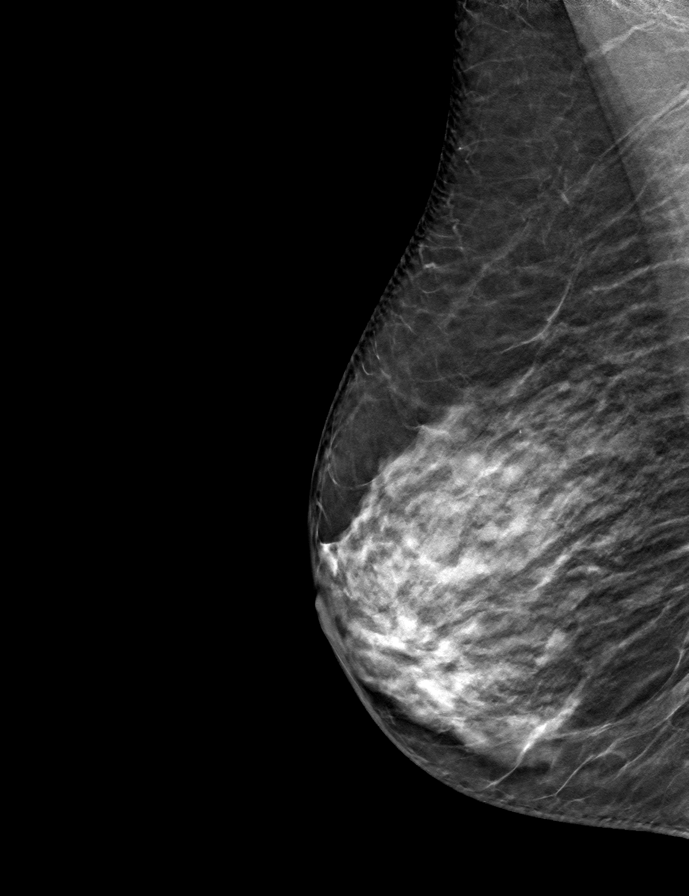
[frame 33/65]
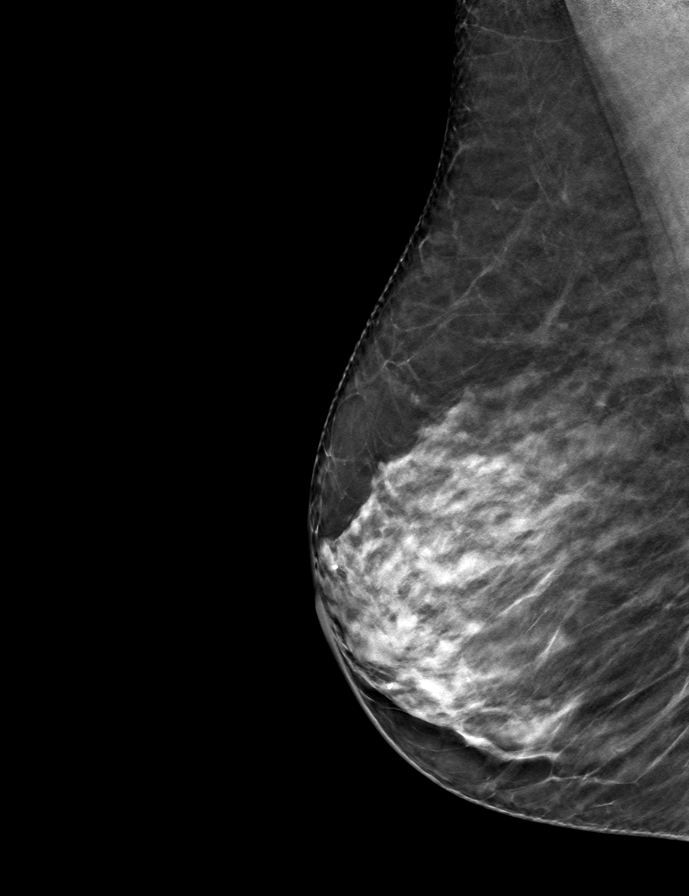

[R CC tomo · tomo slice 32/63.0]
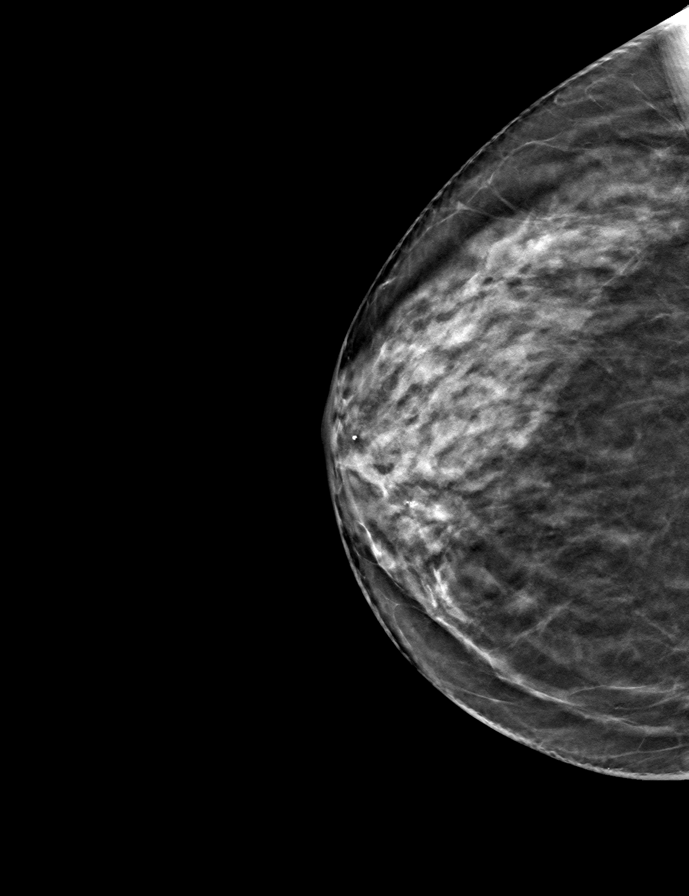

[L MLO tomo · tomo slice 31/62.0]
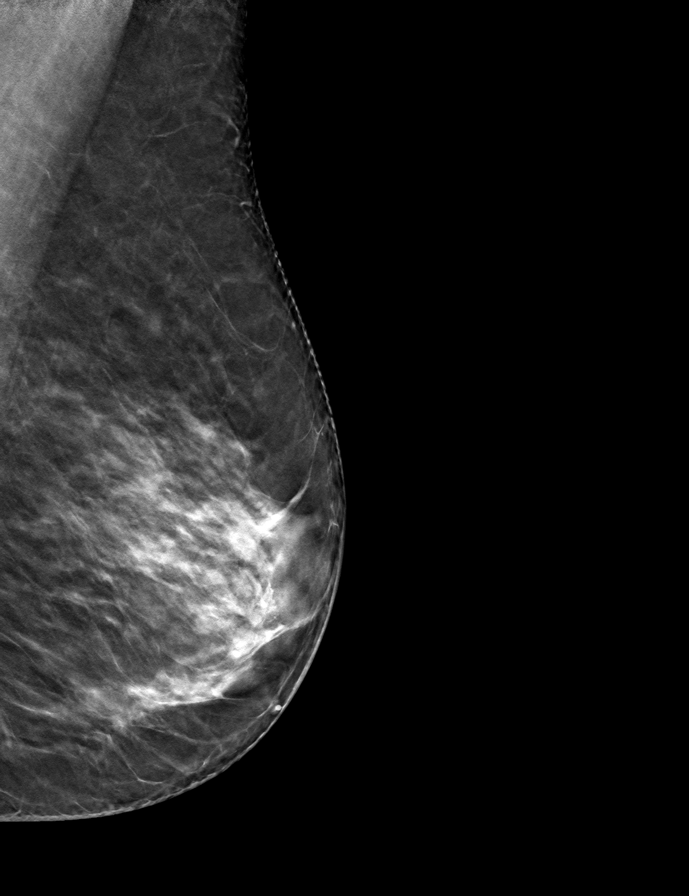

[L CC tomo · tomo slice 32/63.0]
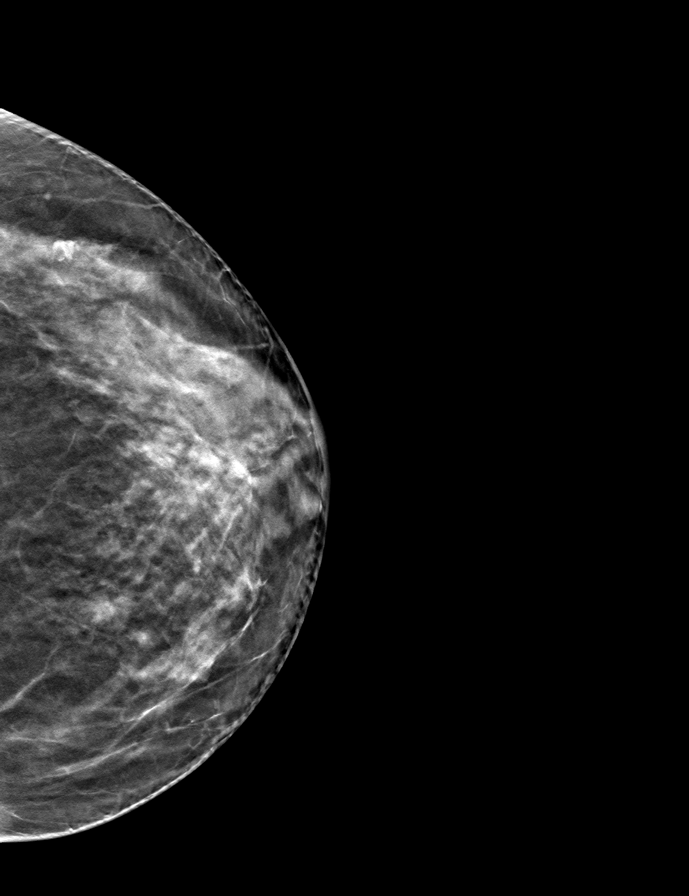

[9 of 24 positions shown; findings below may reference images not displayed]

ACR Breast Density Category c: The breast tissue is heterogeneously
dense, which may obscure small masses.
FINDINGS: In the right breast possible distortion requires further evaluation.

In the left breast an asymmetry requires further evaluation.
IMPRESSION: Further evaluation is suggested for possible distortion in the right
breast.

Further evaluation is suggested for possible asymmetry in the left
breast.

RECOMMENDATION:
Diagnostic mammogram and possibly ultrasound of both breasts.
(Code:[id number])

The patient will be contacted regarding the findings, and additional
imaging will be scheduled.

BI-RADS CATEGORY  0: Incomplete. Need additional imaging evaluation
and/or prior mammograms for comparison.

## 2022-06-29 ENCOUNTER — Other Ambulatory Visit: Payer: Self-pay | Admitting: Family Medicine

## 2022-06-29 DIAGNOSIS — Z1231 Encounter for screening mammogram for malignant neoplasm of breast: Secondary | ICD-10-CM

## 2022-07-07 ENCOUNTER — Other Ambulatory Visit: Payer: Self-pay | Admitting: Internal Medicine

## 2022-08-14 ENCOUNTER — Other Ambulatory Visit: Payer: Self-pay | Admitting: Internal Medicine

## 2022-08-14 DIAGNOSIS — I480 Paroxysmal atrial fibrillation: Secondary | ICD-10-CM

## 2022-08-14 NOTE — Telephone Encounter (Signed)
Refill request

## 2022-08-14 NOTE — Telephone Encounter (Signed)
Prescription refill request for Eliquis received. Indication:Afib  Last office visit: 10/12/21 (End)  Scr: 0.8 (05/02/22)  Age: 77 Weight: 72.1kg  Appropriate dose and refill sent to requested pharmacy.

## 2022-09-18 ENCOUNTER — Ambulatory Visit
Admission: RE | Admit: 2022-09-18 | Discharge: 2022-09-18 | Disposition: A | Discharge status: Home / Self Care | Payer: Medicare HMO | Source: Ambulatory Visit | Attending: Family Medicine | Admitting: Family Medicine

## 2022-09-18 DIAGNOSIS — Z1231 Encounter for screening mammogram for malignant neoplasm of breast: Secondary | ICD-10-CM | POA: Insufficient documentation

## 2022-10-08 ENCOUNTER — Other Ambulatory Visit: Payer: Self-pay | Admitting: Internal Medicine

## 2022-10-18 ENCOUNTER — Ambulatory Visit: Payer: Medicare HMO | Attending: Internal Medicine | Admitting: Internal Medicine

## 2022-10-18 ENCOUNTER — Encounter: Payer: Self-pay | Admitting: Internal Medicine

## 2022-10-18 VITALS — BP 170/90 | HR 73 | Ht 65.75 in | Wt 156.0 lb

## 2022-10-18 DIAGNOSIS — I480 Paroxysmal atrial fibrillation: Secondary | ICD-10-CM | POA: Diagnosis not present

## 2022-10-18 DIAGNOSIS — I10 Essential (primary) hypertension: Secondary | ICD-10-CM | POA: Diagnosis not present

## 2022-10-18 MED ORDER — VALSARTAN 80 MG PO TABS: 80.0000 mg | ORAL_TABLET | Freq: Every day | ORAL | 3 refills | Status: DC

## 2022-10-18 NOTE — Progress Notes (Signed)
Follow-up Outpatient Visit Date: 10/18/2022  Primary Care Provider: Dion Body, MD [facility] [place] [address]  Chief Complaint: Follow-up atrial fibrillation  HPI:  Carly Beltran is a 77 y.o. female with history of paroxysmal atrial fibrillation, hypertension, hyperlipidemia, and kidney stones, who presents for follow-up of atrial fibrillation.  I last saw her a year ago, at which time she was feeling well with only sporadic transient palpitations without associated symptoms.  She was concerned about "pulling" of blood in her feet when seated for long periods of time.  Varicose veins were noted in both legs; compression stocking use was suggested.  Today, Carly Beltran reports that she has been feeling well.  She notes only a single episode of self-limited palpitations over the last year that resolved after ~4 hours.  There were no associated symptoms.  She denies chest pain, shortness of breath, lightheadedness, and edema.  He noted some scant epistaxis that began in the fall when she first started running her heater; this has resolved.  She denies other bleeding.  Home BP's are typically well-controlled; she notes a history of "white coat" hypertension.  --------------------------------------------------------------------------------------------------  Cardiovascular History & Procedures: Cardiovascular Problems: Paroxysmal atrial fibrillation   Risk Factors: Hypertension, hyperlipidemia, and age greater than 43   Cath/PCI: None   CV Surgery: None   EP Procedures and Devices: None   Non-Invasive Evaluation(s): Pharmacologic myocardial perfusion stress test (07/19/17): No evidence of ischemia or scar.  Hyperdynamic left ventricular contraction.  1 mm inferolateral ST depressions with regadenoson. Transthoracic echocardiogram (07/02/17): Normal LV size and wall thickness.  LVEF 65-70% with normal wall motion.  Normal diastolic function.  Trivial  aortic regurgitation.  Normal RV size and function.  Recent CV Pertinent Labs: Lab Results  Component Value Date   CHOL 193 07/02/2017   HDL 91 07/02/2017   LDLCALC 78 07/02/2017   TRIG 120 07/02/2017   CHOLHDL 2.1 07/02/2017   INR 0.98 07/02/2017   K 3.9 08/09/2017   MG 1.7 07/02/2017   BUN 19 08/09/2017   CREATININE 0.74 08/09/2017    Past medical and surgical history were reviewed and updated in EPIC.  Current Meds  Medication Sig   apixaban (ELIQUIS) 5 MG TABS tablet Take 1 tablet by mouth twice daily   atorvastatin (LIPITOR) 10 MG tablet Take 10 mg every evening by mouth.   cholecalciferol (VITAMIN D3) 25 MCG (1000 UT) tablet Take 1,000 Units by mouth daily.   fluticasone (FLONASE) 50 MCG/ACT nasal spray Place 2 sprays into both nostrils daily as needed for allergies or rhinitis.   hydrochlorothiazide (HYDRODIURIL) 25 MG tablet Take 25 mg by mouth daily.    Magnesium Oxide 400 (240 Mg) MG TABS Take 1 tablet (400 mg total) by mouth daily.   metoprolol succinate (TOPROL-XL) 25 MG 24 hr tablet Take 3 tablets by mouth once daily   Multiple Vitamin (MULTIVITAMIN) tablet Take 1 tablet by mouth daily. Reported on 09/12/2015   potassium chloride (KLOR-CON) 10 MEQ tablet Take 1 tablet by mouth once daily   Probiotic Product (PROBIOTIC PO) Take by mouth daily.    Allergies: Ampicillin, Penicillins, and Septra [sulfamethoxazole-trimethoprim]  Social History   Tobacco Use   Smoking status: Never   Smokeless tobacco: Never  Vaping Use   Vaping Use: Never used  Substance Use Topics   Alcohol use: Not Currently    Comment: 4 oz wine weekly   Drug use: No    Family History  Problem Relation Age  of Onset   Atrial fibrillation Mother    CAD Father    Heart disease Brother    CAD Brother        a. s/p 5 vessel CABG at age 48   Breast cancer Cousin     Review of Systems: A 12-system review of systems was performed and was negative except as noted in the  HPI.  --------------------------------------------------------------------------------------------------  Physical Exam: BP (!) 160/92 (BP Location: Left Arm, Patient Position: Sitting, Cuff Size: Normal)   Pulse 73   Ht 5' 5.75" (1.67 m)   Wt 156 lb (70.8 kg)   SpO2 98%   BMI 25.37 kg/m  Repeat BP: 170/90  General:  NAD. Neck: No JVD or HJR. Lungs: Clear to auscultation bilaterally without wheezes or crackles. Heart: Regular rate and rhythm without murmurs, rubs, or gallops. Abdomen: Soft, nontender, nondistended. Extremities: No lower extremity edema.  EKG:  Normal sinus rhythm with nonspecific ST changes.  No significant change from prior tracings on 10/12/2021 and 10/06/2020.  Lab Results  Component Value Date   WBC 3.7 08/09/2017   HGB 13.3 08/09/2017   HCT 40.6 08/09/2017   MCV 92.5 08/09/2017   PLT 130 (L) 08/09/2017    Lab Results  Component Value Date   NA 139 08/09/2017   K 3.9 08/09/2017   CL 105 08/09/2017   CO2 29 08/09/2017   BUN 19 08/09/2017   CREATININE 0.74 08/09/2017   GLUCOSE 96 08/09/2017   ALT 16 07/02/2017    Lab Results  Component Value Date   CHOL 193 07/02/2017   HDL 91 07/02/2017   LDLCALC 78 07/02/2017   TRIG 120 07/02/2017   CHOLHDL 2.1 07/02/2017    --------------------------------------------------------------------------------------------------  ASSESSMENT AND PLAN: Paroxysmal atrial fibrillation: Carly Beltran reports only a single episode of self-limited palpitations over the last year.  She is in sinus rhythm today.  We will continue current doses of apixaban and metoprolol.  Hypertension: BP quite elevated today and at other MD visit.  Patient reports better readings at home.  We have agreed to continue current doses of HCTZ and metoprolol; add valsartan 80 mg daily.  She is scheduled for labs with Dr. Netty Starring in ~2 weeks, at which time BMP should be checked.  She will see Dr. Netty Starring for follow-up at the [facility] of the  month.  Follow-up: Return to clinic in 1 year.  Nelva Bush, MD 10/18/2022 9:41 AM

## 2022-10-18 NOTE — Patient Instructions (Addendum)
Medication Instructions:  Your physician recommends the following medication changes.  START TAKING: Valsartan 80 mg by mouth daily  STOP TAKING Potassium   *If you need a refill on your cardiac medications before your next appointment, please call your pharmacy*   Lab Work: None ordered today   Testing/Procedures: None ordered today   Follow-Up: At [facility], you and your health needs are our priority.  As part of our continuing mission to provide you with exceptional heart care, we have created designated Provider Care Teams.  These Care Teams include your primary Cardiologist (physician) and Advanced Practice Providers (APPs -  Physician Assistants and Nurse Practitioners) who all work together to provide you with the care you need, when you need it.  We recommend signing up for the patient portal called "MyChart".  Sign up information is provided on this After Visit Summary.  MyChart is used to connect with patients for Virtual Visits (Telemedicine).  Patients are able to view lab/test results, encounter notes, upcoming appointments, etc.  Non-urgent messages can be sent to your provider as well.   To learn more about what you can do with MyChart, go to [email or web address].    Your next appointment:   6 month(s)  Provider:   You may see Nelva Bush, MD or one of the following Advanced Practice Providers on your designated Care Team:   Murray Hodgkins, NP Christell Faith, PA-C Cadence Kathlen Mody, PA-C Gerrie Nordmann, NP

## 2022-10-19 ENCOUNTER — Encounter: Payer: Self-pay | Admitting: Internal Medicine

## 2022-11-03 ENCOUNTER — Other Ambulatory Visit: Payer: Self-pay | Admitting: Internal Medicine

## 2023-02-08 ENCOUNTER — Other Ambulatory Visit: Payer: Self-pay | Admitting: Internal Medicine

## 2023-02-08 DIAGNOSIS — I480 Paroxysmal atrial fibrillation: Secondary | ICD-10-CM

## 2023-02-08 NOTE — Telephone Encounter (Signed)
Refill request

## 2023-02-08 NOTE — Telephone Encounter (Signed)
Prescription refill request for Eliquis received. Indication: Afib  Last office visit: 10/18/22 (End)  Scr: 0.7 (10/31/22)  Age: 77 Weight: 70.8kg  Appropriate dose. Refill sent.

## 2023-04-29 ENCOUNTER — Other Ambulatory Visit: Payer: Self-pay | Admitting: Internal Medicine

## 2023-05-02 ENCOUNTER — Other Ambulatory Visit: Payer: Self-pay | Admitting: Family Medicine

## 2023-05-02 DIAGNOSIS — Z1231 Encounter for screening mammogram for malignant neoplasm of breast: Secondary | ICD-10-CM

## 2023-06-28 ENCOUNTER — Ambulatory Visit: Payer: Medicare HMO | Attending: Internal Medicine | Admitting: Internal Medicine

## 2023-06-28 ENCOUNTER — Encounter: Payer: Self-pay | Admitting: Internal Medicine

## 2023-06-28 VITALS — BP 156/78 | HR 69 | Ht 65.0 in | Wt 157.4 lb

## 2023-06-28 DIAGNOSIS — I480 Paroxysmal atrial fibrillation: Secondary | ICD-10-CM | POA: Diagnosis not present

## 2023-06-28 DIAGNOSIS — E785 Hyperlipidemia, unspecified: Secondary | ICD-10-CM | POA: Diagnosis not present

## 2023-06-28 DIAGNOSIS — S60812A Abrasion of left wrist, initial encounter: Secondary | ICD-10-CM | POA: Insufficient documentation

## 2023-06-28 DIAGNOSIS — I10 Essential (primary) hypertension: Secondary | ICD-10-CM

## 2023-06-28 NOTE — Progress Notes (Signed)
Cardiology Office Note:  .   Date:  06/28/2023  ID:  Carly Beltran, DOB [birth year], MRN [id number] PCP: Carly Ivan, MD  Carly Beltran Providers Cardiologist:  Carly Kendall, MD     History of Present Illness: .   Carly Beltran is a 77 y.o. female with history of paroxysmal atrial fibrillation, hypertension, hyperlipidemia, and kidney stones, who presents for follow-up of atrial fibrillation and hypertension.  I last saw her in March, at which time she was feeling well with only rare self-limited palpitations.  Today, she continues to experience brief palpitations on an intermittent basis.  She has not had any prolonged episodes reminiscent of PAF.  She denies chest pain, shortness of breath, and lightheadedness.  She has not had any significant bleeding, remaining on apixaban.  She brings her home BP readings with her today, which are typically less than 140/90.  ROS: Carly Beltran reports a superficial wound over her left wrist that she believes was a burn from a curling iron.  It seemed puffier yesterday; she is scheduled for further evaluation with Carly Beltran this afternoon.  Studies Reviewed: Marland Kitchen   EKG Interpretation Date/Time:  Friday June 28 2023 08:51:45 EST Ventricular Rate:  69 PR Interval:  122 QRS Duration:  78 QT Interval:  372 QTC Calculation: 398 R Axis:   24  Text Interpretation: Sinus rhythm with Premature supraventricular complexes Nonspecific ST abnormality When compared with ECG of 18-Oct-2022 Premature supraventricular complexes are now Present Otherwise no significant change Confirmed by Carly Beltran, Carly Beltran [phone]) on 06/28/2023 5:16:37 PM    Pharmacologic myocardial perfusion stress test (07/19/17): No evidence of ischemia or scar.  Hyperdynamic left ventricular contraction.  1 mm inferolateral ST depressions with regadenoson. Transthoracic echocardiogram (07/02/17): Normal LV size and wall thickness.  LVEF 65-70% with normal wall motion.  Normal  diastolic function.  Trivial aortic regurgitation.  Normal RV size and function.  Risk Assessment/Calculations:    CHA2DS2-VASc Score = 4   This indicates a 4.8% annual risk of stroke. The patient's score is based upon: CHF History: 0 HTN History: 1 Diabetes History: 0 Stroke History: 0 Vascular Disease History: 0 Age Score: 2 Gender Score: 1    HYPERTENSION CONTROL Vitals:   06/28/23 0848 06/28/23 0852  BP: (!) 158/80 (!) 156/78    The patient's blood pressure is elevated above target today.  In order to address the patient's elevated BP: The blood pressure is usually elevated in clinic.  Blood pressures monitored at home have been optimal.; Blood pressure will be monitored at home to determine if medication changes need to be made.          Physical Exam:   VS:  BP (!) 156/78 (BP Location: Left Arm, Patient Position: Sitting, Cuff Size: Normal) Comment: AFTER EKG  Pulse 69   Ht 5\' 5"  (1.651 m)   Wt 157 lb 6 oz (71.4 kg)   SpO2 98%   BMI 26.19 kg/m    Wt Readings from Last 3 Encounters:  06/28/23 157 lb 6 oz (71.4 kg)  10/18/22 156 lb (70.8 kg)  10/12/21 159 lb (72.1 kg)    General:  NAD. Neck: No JVD or HJR. Lungs: Clear to auscultation bilaterally without wheezes or crackles. Heart: Regular rate and rhythm with occasional extrasystoles.  No murmurs, rubs, or gallops. Abdomen: Soft, nontender, nondistended. Extremities: No lower extremity edema.  Small abrasion overlying left wrist with slight surrounding erythema and edema.  ASSESSMENT AND PLAN: .    PAF: Carly Beltran  continues to have brief, self-limited palpitations most consistent with her PAC's or brief atrial runs.  She has not had any prolonged episodes consistent with her prior PAF.  Continue current regimen of metoprolol succinate and apixaban.  Recent labs by Carly Beltran normal.  Left wrist abrasion: Mild swelling and erythema noted.  Proceed with further evaluation this afternoon with Carly Beltran,  as scheduled.  Hypertension: BP moderately elevated again today, though Carly Beltran reports a long history of white coat hypertension.  Home readings are mostly normal.  I have asked her to bring her BP cuff with her to her visit with Carly Beltran this afternoon to ensure that it is providing accurate readings.  If cuff correlates well, Carly Beltran should continue her current regimen of hydrochlorothiazide, metoprolol succinate, and valsartan.  Hyperlipidemia: Continue atorvastatin 10 mg daily with ongoing management by Carly Beltran.    Dispo: Return to clinic in 6 months.  Signed, Carly Kendall, MD

## 2023-06-28 NOTE — Patient Instructions (Addendum)
Medication Instructions:  Your physician recommends that you continue on your current medications as directed. Please refer to the Current Medication list given to you today.   *If you need a refill on your cardiac medications before your next appointment, please call your pharmacy*   Lab Work: No labs ordered today    Testing/Procedures: No test ordered today    Follow-Up: At [facility], you and your health needs are our priority.  As part of our continuing mission to provide you with exceptional heart care, we have created designated Provider Care Teams.  These Care Teams include your primary Cardiologist (physician) and Advanced Practice Providers (APPs -  Physician Assistants and Nurse Practitioners) who all work together to provide you with the care you need, when you need it.  We recommend signing up for the patient portal called "MyChart".  Sign up information is provided on this After Visit Summary.  MyChart is used to connect with patients for Virtual Visits (Telemedicine).  Patients are able to view lab/test results, encounter notes, upcoming appointments, etc.  Non-urgent messages can be sent to your provider as well.   To learn more about what you can do with MyChart, go to [email or web address].    Your next appointment:   6 month(s)  Provider:   You may see Yvonne Kendall, MD or one of the following Advanced Practice Providers on your designated Care Team:   Nicolasa Ducking, NP Eula Listen, PA-C Cadence Fransico Michael, PA-C Charlsie Quest, NP Carlos Levering, NP    Dr. Okey Dupre recommends bringing your blood pressure machine to your primary care visit today for comparison.

## 2023-07-24 ENCOUNTER — Other Ambulatory Visit: Payer: Self-pay | Admitting: Internal Medicine

## 2023-07-25 ENCOUNTER — Other Ambulatory Visit: Payer: Self-pay | Admitting: Internal Medicine

## 2023-07-29 ENCOUNTER — Other Ambulatory Visit: Payer: Self-pay | Admitting: Internal Medicine

## 2023-07-29 DIAGNOSIS — I480 Paroxysmal atrial fibrillation: Secondary | ICD-10-CM

## 2023-07-29 NOTE — Telephone Encounter (Signed)
Prescription refill request for Eliquis received. Indication:afib Last office visit:11/24 Scr:0.7  9/24 Age: 77 Weight:71.4  kg  Prescription refilled

## 2023-07-29 NOTE — Telephone Encounter (Signed)
Please review

## 2023-09-16 ENCOUNTER — Other Ambulatory Visit: Payer: Self-pay | Admitting: Internal Medicine

## 2023-09-20 ENCOUNTER — Ambulatory Visit
Admission: RE | Admit: 2023-09-20 | Discharge: 2023-09-20 | Disposition: A | Discharge status: Home / Self Care | Payer: Medicare HMO | Source: Ambulatory Visit | Attending: Family Medicine | Admitting: Family Medicine

## 2023-09-20 DIAGNOSIS — Z1231 Encounter for screening mammogram for malignant neoplasm of breast: Secondary | ICD-10-CM | POA: Insufficient documentation

## 2023-11-28 ENCOUNTER — Other Ambulatory Visit (HOSPITAL_COMMUNITY): Payer: Self-pay

## 2023-11-28 ENCOUNTER — Other Ambulatory Visit: Payer: Self-pay | Admitting: *Deleted

## 2023-11-28 DIAGNOSIS — I480 Paroxysmal atrial fibrillation: Secondary | ICD-10-CM

## 2023-11-28 MED ORDER — APIXABAN 5 MG PO TABS
5.0000 mg | ORAL_TABLET | Freq: Two times a day (BID) | ORAL | 1 refills | Status: DC
  Filled 2023-11-28: qty 180, 90d supply, fill #0

## 2023-11-28 MED ORDER — APIXABAN 5 MG PO TABS: 5.0000 mg | ORAL_TABLET | Freq: Two times a day (BID) | ORAL | 1 refills | Status: DC

## 2023-11-28 NOTE — Telephone Encounter (Signed)
 Prescription refill request for Eliquis received. Indication: afib  Last office visit: End, 06/28/2023 Scr: 0.7 11/06/2023 Age: 79 yo  Weight: 71.4 kg   Refill sent.

## 2023-12-25 ENCOUNTER — Encounter: Payer: Self-pay | Admitting: Internal Medicine

## 2023-12-25 ENCOUNTER — Ambulatory Visit: Payer: Medicare HMO | Attending: Internal Medicine | Admitting: Internal Medicine

## 2023-12-25 VITALS — BP 160/82 | HR 71 | Ht 65.75 in | Wt 153.0 lb

## 2023-12-25 DIAGNOSIS — I10 Essential (primary) hypertension: Secondary | ICD-10-CM

## 2023-12-25 DIAGNOSIS — I480 Paroxysmal atrial fibrillation: Secondary | ICD-10-CM

## 2023-12-25 DIAGNOSIS — E785 Hyperlipidemia, unspecified: Secondary | ICD-10-CM | POA: Diagnosis not present

## 2023-12-25 DIAGNOSIS — D696 Thrombocytopenia, unspecified: Secondary | ICD-10-CM

## 2023-12-25 NOTE — Progress Notes (Unsigned)
 Cardiology Office Note:  .   Date:  12/26/2023  ID:  Carly Beltran, DOB 06-30-1946, MRN [id number] PCP: Monique Ano, MD  [facility] Providers Cardiologist:  Sammy Crisp, MD     History of Present Illness: .   Carly Beltran is a 78 y.o. female with history of paroxysmal atrial fibrillation, hypertension, hyperlipidemia, and kidney stones, who presents for follow-up of atrial fibrillation and hypertension.  I last saw her in 06/2023, at which time she reported sporadic palpitations that were self-limited and without associated symptoms.  We did not make any medication changes or pursue additional testing.  Today, Carly Beltran reports that she has been feeling fairly well.  She denies frank palpitations but sometimes notes some irregularity in her pulse when she checks it manually.  She has not had any chest pain, shortness of breath, lightheadedness, or edema.  She had a few brief nosebleeds this winter, which she attributes to the dry air.  She notes that her home blood pressures have been well-controlled with systolic readings usually around 120 mmHg.  She reports a long history of whitecoat hypertension.  She remains compliant with her medications.  ROS: See HPI  Studies Reviewed: Carly Beltran   EKG Interpretation Date/Time:  Wednesday Dec 25 2023 10:51:44 EDT Ventricular Rate:  71 PR Interval:  118 QRS Duration:  90 QT Interval:  392 QTC Calculation: 425 R Axis:   24  Text Interpretation: Normal sinus rhythm with sinus arrhythmia Normal ECG When compared with ECG of 28-Jun-2023 08:51, Premature supraventricular complexes are no longer Present Confirmed by Murrell Elizondo, Veryl Gottron [phone]) on 12/26/2023 11:03:04 AM    Pharmacologic myocardial perfusion stress test (07/19/17): No evidence of ischemia or scar.  Hyperdynamic left ventricular contraction.  1 mm inferolateral ST depressions with regadenoson.  Transthoracic echocardiogram (07/02/17): Normal LV size and wall thickness.  LVEF  65-70% with normal wall motion.  Normal diastolic function.  Trivial aortic regurgitation.  Normal RV size and function.  Risk Assessment/Calculations:    CHA2DS2-VASc Score = 4   This indicates a 4.8% annual risk of stroke. The patient's score is based upon: CHF History: 0 HTN History: 1 Diabetes History: 0 Stroke History: 0 Vascular Disease History: 0 Age Score: 2 Gender Score: 1    HYPERTENSION CONTROL Vitals:   12/25/23 1046 12/25/23 1106  BP: (!) 156/70 (!) 160/82    The patient's blood pressure is elevated above target today.  In order to address the patient's elevated BP: Blood pressure will be monitored at home to determine if medication changes need to be made.; The blood pressure is usually elevated in clinic.  Blood pressures monitored at home have been optimal.          Physical Exam:   VS:  BP (!) 160/82 (Cuff Size: Normal)   Pulse 71   Ht 5' 5.75" (1.67 m)   Wt 153 lb (69.4 kg)   SpO2 98%   BMI 24.88 kg/m    Wt Readings from Last 3 Encounters:  12/25/23 153 lb (69.4 kg)  06/28/23 157 lb 6 oz (71.4 kg)  10/18/22 156 lb (70.8 kg)    General:  NAD. Neck: No JVD or HJR. Lungs: Clear to auscultation bilaterally without wheezes or crackles. Heart: Regular rate and rhythm without murmurs, rubs, or gallops. Abdomen: Soft, nontender, nondistended. Extremities: No lower extremity edema.  ASSESSMENT AND PLAN: .    Paroxysmal atrial fibrillation and thrombocytopenia: Carly Beltran denies palpitations but notes some intermittent irregularity in her pulse when  checking it manually.  She is in sinus rhythm again today.  We discussed ambulatory cardiac monitoring to further evaluate this pulse irregularity, though given lack of symptoms and plan for indefinite anticoagulation in the setting of known PAF, we have agreed to defer this.  Continue current regimen of metoprolol  succinate and apixaban .  Labs in 10/2023 through Dr. Agnes Hose showed normal renal function and  hemoglobin.  Mild chronic thrombocytopenia unchanged from baseline.  Hypertension: Blood pressure moderately elevated today, which is typical for Carly Beltran in the office.  She has a long history of whitecoat hypertension and reports normal readings at home.  We have agreed to defer medication changes today continuing her current regimen of HCTZ, metoprolol  succinate, and valsartan .  Hyperlipidemia: LDL reasonable on recent check through Dr. Agnes Hose office.  Continue rosuvastatin under the direction of Dr. Jerone Moorman.    Dispo: Return to clinic in 1 year.  Signed, Sammy Crisp, MD

## 2023-12-25 NOTE — Patient Instructions (Signed)
 Medication Instructions:  Your physician recommends that you continue on your current medications as directed. Please refer to the Current Medication list given to you today.   *If you need a refill on your cardiac medications before your next appointment, please call your pharmacy*  Lab Work: No labs ordered today   Testing/Procedures: No test ordered today   Follow-Up: At [facility], you and your health needs are our priority.  As part of our continuing mission to provide you with exceptional heart care, our providers are all part of one team.  This team includes your primary Cardiologist (physician) and Advanced Practice Providers or APPs (Physician Assistants and Nurse Practitioners) who all work together to provide you with the care you need, when you need it.  Your next appointment:   1 year(s)  Provider:   You may see Yvonne Kendall, MD or one of the following Advanced Practice Providers on your designated Care Team:   Nicolasa Ducking, NP Ames Dura, PA-C Eula Listen, PA-C Cadence Grayson, PA-C Charlsie Quest, NP Carlos Levering, NP    We recommend signing up for the patient portal called "MyChart".  Sign up information is provided on this After Visit Summary.  MyChart is used to connect with patients for Virtual Visits (Telemedicine).  Patients are able to view lab/test results, encounter notes, upcoming appointments, etc.  Non-urgent messages can be sent to your provider as well.   To learn more about what you can do with MyChart, go to [email or web address].

## 2023-12-26 ENCOUNTER — Encounter: Payer: Self-pay | Admitting: Internal Medicine

## 2024-04-22 ENCOUNTER — Other Ambulatory Visit: Payer: Self-pay | Admitting: Internal Medicine

## 2024-05-19 ENCOUNTER — Other Ambulatory Visit: Payer: Self-pay | Admitting: Internal Medicine

## 2024-05-19 DIAGNOSIS — I480 Paroxysmal atrial fibrillation: Secondary | ICD-10-CM

## 2024-05-19 NOTE — Telephone Encounter (Signed)
 Prescription refill request for Eliquis  received. Indication: PAF Last office visit: 12/25/23  C End MD Scr: 0.6 on 05/14/24  Epic Age: 78 Weight: 69.4kg  Based on above findings Eliquis  5mg  twice daily is the appropriate dose.  Refill approved.

## 2024-07-01 ENCOUNTER — Other Ambulatory Visit: Payer: Self-pay | Admitting: Family Medicine

## 2024-07-01 DIAGNOSIS — Z1231 Encounter for screening mammogram for malignant neoplasm of breast: Secondary | ICD-10-CM

## 2024-08-31 ENCOUNTER — Other Ambulatory Visit: Payer: Self-pay | Admitting: Internal Medicine

## 2024-09-22 ENCOUNTER — Encounter
